# Patient Record
Sex: Female | Born: 1980 | Race: White | Hispanic: No | Marital: Married | Smoking: Current every day smoker
Health system: Southern US, Community
[De-identification: ages and names within clinical notes are randomized; demographics above are authoritative.]

## PROBLEM LIST (undated history)

## (undated) DIAGNOSIS — N946 Dysmenorrhea, unspecified: Secondary | ICD-10-CM

## (undated) DIAGNOSIS — M1990 Unspecified osteoarthritis, unspecified site: Secondary | ICD-10-CM

## (undated) DIAGNOSIS — J45909 Unspecified asthma, uncomplicated: Secondary | ICD-10-CM

## (undated) DIAGNOSIS — Z87442 Personal history of urinary calculi: Secondary | ICD-10-CM

## (undated) DIAGNOSIS — M797 Fibromyalgia: Secondary | ICD-10-CM

## (undated) DIAGNOSIS — F419 Anxiety disorder, unspecified: Secondary | ICD-10-CM

## (undated) DIAGNOSIS — F32A Depression, unspecified: Secondary | ICD-10-CM

## (undated) DIAGNOSIS — F329 Major depressive disorder, single episode, unspecified: Secondary | ICD-10-CM

## (undated) HISTORY — PX: COLPOSCOPY: SHX161

## (undated) HISTORY — DX: Depression, unspecified: F32.A

## (undated) HISTORY — DX: Anxiety disorder, unspecified: F41.9

## (undated) HISTORY — PX: TUBAL LIGATION: SHX77

## (undated) HISTORY — DX: Fibromyalgia: M79.7

## (undated) HISTORY — PX: CERVICAL BIOPSY  W/ LOOP ELECTRODE EXCISION: SUR135

## (undated) HISTORY — DX: Unspecified asthma, uncomplicated: J45.909

## (undated) HISTORY — DX: Unspecified osteoarthritis, unspecified site: M19.90

## (undated) HISTORY — DX: Major depressive disorder, single episode, unspecified: F32.9

## (undated) HISTORY — DX: Dysmenorrhea, unspecified: N94.6

---

## 2004-07-10 HISTORY — PX: WISDOM TOOTH EXTRACTION: SHX21

## 2014-07-10 DIAGNOSIS — M797 Fibromyalgia: Secondary | ICD-10-CM

## 2014-07-10 HISTORY — DX: Fibromyalgia: M79.7

## 2017-11-29 ENCOUNTER — Ambulatory Visit: Payer: Self-pay | Admitting: Family Medicine

## 2017-11-29 DIAGNOSIS — Z0289 Encounter for other administrative examinations: Secondary | ICD-10-CM

## 2017-11-30 ENCOUNTER — Encounter: Payer: Self-pay | Admitting: *Deleted

## 2017-11-30 ENCOUNTER — Encounter: Payer: Self-pay | Admitting: Family Medicine

## 2017-11-30 ENCOUNTER — Ambulatory Visit (INDEPENDENT_AMBULATORY_CARE_PROVIDER_SITE_OTHER): Payer: Managed Care, Other (non HMO) | Admitting: Family Medicine

## 2017-11-30 VITALS — BP 122/72 | HR 93 | Temp 98.4°F | Ht 65.0 in | Wt 163.2 lb

## 2017-11-30 DIAGNOSIS — Z975 Presence of (intrauterine) contraceptive device: Secondary | ICD-10-CM | POA: Diagnosis not present

## 2017-11-30 DIAGNOSIS — Z30432 Encounter for removal of intrauterine contraceptive device: Secondary | ICD-10-CM

## 2017-11-30 DIAGNOSIS — Z0000 Encounter for general adult medical examination without abnormal findings: Secondary | ICD-10-CM | POA: Diagnosis not present

## 2017-11-30 DIAGNOSIS — R197 Diarrhea, unspecified: Secondary | ICD-10-CM

## 2017-11-30 DIAGNOSIS — M797 Fibromyalgia: Secondary | ICD-10-CM

## 2017-11-30 DIAGNOSIS — Z114 Encounter for screening for human immunodeficiency virus [HIV]: Secondary | ICD-10-CM | POA: Diagnosis not present

## 2017-11-30 LAB — C-REACTIVE PROTEIN: CRP: 0.2 mg/dL — AB (ref 0.5–20.0)

## 2017-11-30 LAB — COMPREHENSIVE METABOLIC PANEL
ALBUMIN: 4.2 g/dL (ref 3.5–5.2)
ALK PHOS: 69 U/L (ref 39–117)
ALT: 10 U/L (ref 0–35)
AST: 13 U/L (ref 0–37)
BUN: 12 mg/dL (ref 6–23)
CO2: 25 mEq/L (ref 19–32)
Calcium: 9.4 mg/dL (ref 8.4–10.5)
Chloride: 105 mEq/L (ref 96–112)
Creatinine, Ser: 0.74 mg/dL (ref 0.40–1.20)
GFR: 94.15 mL/min (ref >60.00)
GLUCOSE: 82 mg/dL (ref 70–99)
Potassium: 3.9 mEq/L (ref 3.5–5.1)
Sodium: 139 mEq/L (ref 135–145)
Total Bilirubin: 0.7 mg/dL (ref 0.2–1.2)
Total Protein: 6.8 g/dL (ref 6.0–8.3)

## 2017-11-30 LAB — CBC WITH DIFFERENTIAL/PLATELET
BASOS ABS: 0.1 10*3/uL (ref 0.0–0.1)
Basophils Relative: 0.7 % (ref 0.0–3.0)
Eosinophils Absolute: 0.1 10*3/uL (ref 0.0–0.7)
Eosinophils Relative: 1.5 % (ref 0.0–5.0)
HCT: 46 % (ref 36.0–46.0)
HEMOGLOBIN: 16 g/dL — AB (ref 12.0–15.0)
LYMPHS ABS: 2.3 10*3/uL (ref 0.7–4.0)
LYMPHS PCT: 26.6 % (ref 12.0–46.0)
MCHC: 34.9 g/dL (ref 30.0–36.0)
MCV: 88.2 fl (ref 78.0–100.0)
MONOS PCT: 8.9 % (ref 3.0–12.0)
Monocytes Absolute: 0.8 10*3/uL (ref 0.1–1.0)
NEUTROS PCT: 62.3 % (ref 43.0–77.0)
Neutro Abs: 5.3 10*3/uL (ref 1.4–7.7)
Platelets: 166 10*3/uL (ref 150.0–400.0)
RBC: 5.21 Mil/uL — ABNORMAL HIGH (ref 3.87–5.11)
RDW: 13.5 % (ref 11.5–15.5)
WBC: 8.5 10*3/uL (ref 4.0–10.5)

## 2017-11-30 LAB — LIPID PANEL
Cholesterol: 198 mg/dL (ref 0–200)
HDL: 37.1 mg/dL — ABNORMAL LOW (ref >39.00)
NONHDL: 160.6
TRIGLYCERIDES: 219 mg/dL — AB (ref 0.0–149.0)
Total CHOL/HDL Ratio: 5
VLDL: 43.8 mg/dL — ABNORMAL HIGH (ref 0.0–40.0)

## 2017-11-30 LAB — TSH: TSH: 0.73 u[IU]/mL (ref 0.35–4.50)

## 2017-11-30 LAB — SEDIMENTATION RATE: Sed Rate: 26 mm/hr — ABNORMAL HIGH (ref 0–20)

## 2017-11-30 LAB — LDL CHOLESTEROL, DIRECT: Direct LDL: 129 mg/dL

## 2017-11-30 MED ORDER — VENLAFAXINE HCL ER 37.5 MG PO CP24: 37.5000 mg | ORAL_CAPSULE | Freq: Every day | ORAL | 1 refills | Status: DC

## 2017-11-30 NOTE — Progress Notes (Signed)
Patient: Kelli Washington MRN: [id number] DOB: December 24, 1980 PCP: Orma Flaming, MD     Subjective:  Chief Complaint  Patient presents with  . Establish Care  . Fibromyalgia    having pain all over pt rates a 6/10    HPI: The patient is a 37 y.o. female who presents today for annual exam. She denies any changes to past medical history. There have been no recent hospitalizations. They are not following a well balanced diet and exercise plan. Weight has been stable.  She works in a call center 8 hours/day and then has 3 kids that have activities. She also works 3 nights a week at [employer]. She recently got married and is also needing her IUD out.   Fibromyalgia: she was diagnosed about 2-3 years ago. Has been on cymbalta, gabapentin, flexeril, valium, skelaxin. 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 She states she just deals with the pain now. She occasionally takes ibuprofen, but not often. She has also done PT with not much benefit. She had full work up done for RA, ANA and imaging. She does not exercise or do yoga but felt good when she had a regular work out program.   Stomach pain: She states she started to have bowel issues about one year ago. She states it is progressing. She has immediate diarrhea after eating some meals. She will have knots in her stomach/cramping and then will have immediate diarrhea. She usually feels better after she uses the bathroom. No N/V or blood in stool. She does state sometimes she feels like food gets stuck in her esopahgus and she gets pain with some food. No family hx of IBD. She has had her gallbladder checked a "few times" for this same issue. Seems like this has been going on longer than a year. Very rarely has constipation. No endoscope or colonoscopy has been done.    Immunization History  Administered Date(s) Administered  . Tdap 04/02/2012    Pap smear: 1 year ago. Hx of LEEP x 2. LEEP last done 4 years ago and pap smears normal  since that time.    Review of Systems  Constitutional: Negative for chills, fatigue and fever.  HENT: Negative for sinus pain and sore throat.   Respiratory: Negative for cough.   Cardiovascular: Negative.   Gastrointestinal: Positive for abdominal pain and diarrhea. Negative for blood in stool, constipation, nausea and vomiting.  Genitourinary: Negative for difficulty urinating, dysuria, frequency, urgency, vaginal bleeding, vaginal discharge and vaginal pain.  Musculoskeletal: Positive for arthralgias, back pain, joint swelling, myalgias, neck pain and neck stiffness.  Neurological: Positive for dizziness, numbness and headaches.    Allergies Patient has No Known Allergies.  Past Medical History Patient  has a past medical history of Arthritis, Asthma, Fibromyalgia (2016), and Vaginal delivery (1999,2002,2010,2013).  Surgical History Patient  has a past surgical history that includes Tubal ligation and Wisdom tooth extraction (2006).  Family History Pateint's family history includes Arthritis in her mother; Asthma in her mother; Cancer in her mother; Depression in her mother; Drug abuse in her father and mother; Early death in her mother; Heart attack in her father; Heart disease in her father; Kidney disease in her father; Stroke in her father.  Social History Patient  reports that she has been smoking cigarettes.  She has been smoking about 1.00 pack per day. She has never used smokeless tobacco. She reports that she drinks alcohol. She reports that she does not use drugs.    Objective:  Vitals:   11/30/17 1100  BP: 122/72  Pulse: 93  Temp: 98.4 F (36.9 C)  TempSrc: Oral  SpO2: 95%  Weight: 163 lb 4 oz (74 kg)  Height: '5\' 5"'  (1.651 m)    Body mass index is 27.17 kg/m.  Physical Exam  Constitutional: She is oriented to person, place, and time. She appears well-developed and well-nourished.  Tobacco odor. Piercing in chin and multiple tattoos.   HENT:  Right Ear:  External ear normal.  Left Ear: External ear normal.  Mouth/Throat: Oropharynx is clear and moist.  Eyes: Pupils are equal, round, and reactive to light. Conjunctivae and EOM are normal.  Neck: Normal range of motion. Neck supple. No thyromegaly present.  Cardiovascular: Normal rate, regular rhythm, normal heart sounds and intact distal pulses.  No murmur heard. Pulmonary/Chest: Effort normal and breath sounds normal.  Abdominal: Soft. Bowel sounds are normal. She exhibits no distension. There is no tenderness.  Genitourinary: Vagina normal.  Lymphadenopathy:    She has no cervical adenopathy.  Neurological: She is alert and oriented to person, place, and time. She displays normal reflexes. No cranial nerve deficit. Coordination normal.  Skin: Skin is warm and dry. No rash noted.  Psychiatric: She has a normal mood and affect. Her behavior is normal.  Vitals reviewed.   iud removal -verbal consent obtained. Risks discussed included, but not limited to, pain, bleeding, discharge. Immediately fertile, but she has BTL. Speculum inserted without any issue and cervix visualized. Strings visualized out of os.  skylar forceps used to grasp strings and IUD easily removed with no complications. Patient tolerated well.     Assessment/plan: 1. Annual physical exam Routine lab work. IUD removed. Need records from old physician and am requesting these as she has had what appears to be a lot of work ups. Discussed need for smoking cessation.  Patient counseling '[x]'    Nutrition: Stressed importance of moderation in sodium/caffeine intake, saturated fat and cholesterol, caloric balance, sufficient intake of fresh fruits, vegetables, fiber, calcium, iron, and 1 mg of folate supplement per day (for females capable of pregnancy).  '[x]'    Stressed the importance of regular exercise.   '[]'    Substance Abuse: Discussed cessation/primary prevention of tobacco, alcohol, or other drug use; driving or other dangerous  activities under the influence; availability of treatment for abuse.   '[x]'    Injury prevention: Discussed safety belts, safety helmets, smoke detector, smoking near bedding or upholstery.   '[x]'    Sexuality: Discussed sexually transmitted diseases, partner selection, use of condoms, avoidance of unintended pregnancy  and contraceptive alternatives.  '[x]'    Dental health: Discussed importance of regular tooth brushing, flossing, and dental visits.  '[x]'    Health maintenance and immunizations reviewed. Please refer to Health maintenance section.    - CBC with Differential/Platelet - Comprehensive metabolic panel - TSH - Lipid panel  2. Encounter for screening for HIV  - HIV antibody  3. Fibromyalgia She has been on multiple agents and failed them all. She also admits to MJ use and I will not give her narcotics or lyrica. Will do trial of effexor. Side effects discussed and SI/HI precautions given. Call 911 or go to ED if this happens. She has no past hx of this. Could also do trial of CBD oil. Want her to try yoga or some for of exercise as this is the best treatment for fibromyalgia. Will see her back in one month to see how medication/exercise is working for her. Call sooner if any  issues with medication.   4. Diarrhea, unspecified type Hx consistent with IBS-D, but long standing hx will check for IBD with ESR/CRP and celiac's panel. Send to GI to see if she needs scoped. Also discussed nature of her IBS, smoking and foods to avoid. Commonly seen with fibromyalgia. Want her to keep a food journal over the next month so we can see what foods trigger this and how long cramping lasts or if relieved with diarrhea.  - C-reactive protein - Sedimentation rate - Ambulatory referral to Gastroenterology - Gliadin antibodies, serum - Tissue transglutaminase, IgA - Reticulin Antibody, IgA w reflex titer  5. Encounter for IUD removal IUD removed with no complications. BTL for birth control. F/u with gyn  for next IUD insertion. discussed may have some irregular bleeding/cramping.   6. IUD contraception  - Ambulatory referral to Gynecology    Return in about 1 month (around 12/31/2017) for drug/fibro .     Orma Flaming, MD [place]  11/30/2017

## 2017-12-04 ENCOUNTER — Encounter: Payer: Self-pay | Admitting: Internal Medicine

## 2017-12-04 ENCOUNTER — Telehealth: Payer: Self-pay | Admitting: Obstetrics and Gynecology

## 2017-12-04 NOTE — Telephone Encounter (Signed)
Called and left a message for patient to call back to schedule a new patient doctor referral appointment with our office to see any provider for IUD placement.

## 2017-12-05 LAB — TISSUE TRANSGLUTAMINASE, IGA: (tTG) Ab, IgA: 1 U/mL

## 2017-12-05 LAB — GLIADIN ANTIBODIES, SERUM
GLIADIN IGA: 5 U
Gliadin IgG: 8 Units

## 2017-12-05 LAB — HIV ANTIBODY (ROUTINE TESTING W REFLEX): HIV: NONREACTIVE

## 2017-12-05 LAB — RETICULIN ANTIBODIES, IGA W TITER: RETICULIN IGA SCREEN: NEGATIVE

## 2017-12-06 ENCOUNTER — Encounter: Payer: Self-pay | Admitting: Obstetrics and Gynecology

## 2017-12-06 ENCOUNTER — Other Ambulatory Visit (HOSPITAL_COMMUNITY)
Admission: RE | Admit: 2017-12-06 | Discharge: 2017-12-06 | Disposition: A | Discharge status: Home / Self Care | Payer: Managed Care, Other (non HMO) | Source: Ambulatory Visit | Attending: Obstetrics and Gynecology | Admitting: Obstetrics and Gynecology

## 2017-12-06 ENCOUNTER — Other Ambulatory Visit: Payer: Self-pay

## 2017-12-06 ENCOUNTER — Ambulatory Visit (INDEPENDENT_AMBULATORY_CARE_PROVIDER_SITE_OTHER): Payer: Managed Care, Other (non HMO) | Admitting: Obstetrics and Gynecology

## 2017-12-06 VITALS — BP 102/60 | HR 68 | Resp 16 | Ht 65.0 in | Wt 162.0 lb

## 2017-12-06 DIAGNOSIS — Z124 Encounter for screening for malignant neoplasm of cervix: Secondary | ICD-10-CM | POA: Insufficient documentation

## 2017-12-06 DIAGNOSIS — N898 Other specified noninflammatory disorders of vagina: Secondary | ICD-10-CM

## 2017-12-06 DIAGNOSIS — N644 Mastodynia: Secondary | ICD-10-CM

## 2017-12-06 DIAGNOSIS — Z01419 Encounter for gynecological examination (general) (routine) without abnormal findings: Secondary | ICD-10-CM | POA: Diagnosis not present

## 2017-12-06 DIAGNOSIS — N920 Excessive and frequent menstruation with regular cycle: Secondary | ICD-10-CM

## 2017-12-06 NOTE — Patient Instructions (Addendum)
To try and decrease your breast pain, you should have a well fitting supportive bra, cut back on caffeine, and use ice or heat as needed. Some women find relief with the supplement evening primrose oil.  EXERCISE AND DIET:  We recommended that you start or continue a regular exercise program for good health. Regular exercise means any activity that makes your heart beat faster and makes you sweat.  We recommend exercising at least 30 minutes per day at least 3 days a week, preferably 4 or 5.  We also recommend a diet low in fat and sugar.  Inactivity, poor dietary choices and obesity can cause diabetes, heart attack, stroke, and kidney damage, among others.    ALCOHOL AND SMOKING:  Women should limit their alcohol intake to no more than 7 drinks/beers/glasses of wine (combined, not each!) per week. Moderation of alcohol intake to this level decreases your risk of breast cancer and liver damage. And of course, no recreational drugs are part of a healthy lifestyle.  And absolutely no smoking or even second hand smoke. Most people know smoking can cause heart and lung diseases, but did you know it also contributes to weakening of your bones? Aging of your skin?  Yellowing of your teeth and nails?  CALCIUM AND VITAMIN D:  Adequate intake of calcium and Vitamin D are recommended.  The recommendations for exact amounts of these supplements seem to change often, but generally speaking 600 mg of calcium (either carbonate or citrate) and 800 units of Vitamin D per day seems prudent. Certain women may benefit from higher intake of Vitamin D.  If you are among these women, your doctor will have told you during your visit.    PAP SMEARS:  Pap smears, to check for cervical cancer or precancers,  have traditionally been done yearly, although recent scientific advances have shown that most women can have pap smears less often.  However, every woman still should have a physical exam from her gynecologist every year. It will  include a breast check, inspection of the vulva and vagina to check for abnormal growths or skin changes, a visual exam of the cervix, and then an exam to evaluate the size and shape of the uterus and ovaries.  And after 37 years of age, a rectal exam is indicated to check for rectal cancers. We will also provide age appropriate advice regarding health maintenance, like when you should have certain vaccines, screening for sexually transmitted diseases, bone density testing, colonoscopy, mammograms, etc.   MAMMOGRAMS:  All women over 56 years old should have a yearly mammogram. Many facilities now offer a "3D" mammogram, which may cost around $50 extra out of pocket. If possible,  we recommend you accept the option to have the 3D mammogram performed.  It both reduces the number of women who will be called back for extra views which then turn out to be normal, and it is better than the routine mammogram at detecting truly abnormal areas.    COLONOSCOPY:  Colonoscopy to screen for colon cancer is recommended for all women at age 21.  We know, you hate the idea of the prep.  We agree, BUT, having colon cancer and not knowing it is worse!!  Colon cancer so often starts as a polyp that can be seen and removed at colonscopy, which can quite literally save your life!  And if your first colonoscopy is normal and you have no family history of colon cancer, most women don't have to have  it again for 10 years.  Once every ten years, you can do something that may end up saving your life, right?  We will be happy to help you get it scheduled when you are ready.  Be sure to check your insurance coverage so you understand how much it will cost.  It may be covered as a preventative service at no cost, but you should check your particular policy.      Breast Self-Awareness Breast self-awareness means being familiar with how your breasts look and feel. It involves checking your breasts regularly and reporting any changes to  your health care provider. Practicing breast self-awareness is important. A change in your breasts can be a sign of a serious medical problem. Being familiar with how your breasts look and feel allows you to find any problems early, when treatment is more likely to be successful. All women should practice breast self-awareness, including women who have had breast implants. How to do a breast self-exam One way to learn what is normal for your breasts and whether your breasts are changing is to do a breast self-exam. To do a breast self-exam: Look for Changes  1. Remove all the clothing above your waist. 2. Stand in front of a mirror in a room with good lighting. 3. Put your hands on your hips. 4. Push your hands firmly downward. 5. Compare your breasts in the mirror. Look for differences between them (asymmetry), such as: ? Differences in shape. ? Differences in size. ? Puckers, dips, and bumps in one breast and not the other. 6. Look at each breast for changes in your skin, such as: ? Redness. ? Scaly areas. 7. Look for changes in your nipples, such as: ? Discharge. ? Bleeding. ? Dimpling. ? Redness. ? A change in position. Feel for Changes  Carefully feel your breasts for lumps and changes. It is best to do this while lying on your back on the floor and again while sitting or standing in the shower or tub with soapy water on your skin. Feel each breast in the following way:  Place the arm on the side of the breast you are examining above your head.  Feel your breast with the other hand.  Start in the nipple area and make  inch (2 cm) overlapping circles to feel your breast. Use the pads of your three middle fingers to do this. Apply light pressure, then medium pressure, then firm pressure. The light pressure will allow you to feel the tissue closest to the skin. The medium pressure will allow you to feel the tissue that is a little deeper. The firm pressure will allow you to feel the  tissue close to the ribs.  Continue the overlapping circles, moving downward over the breast until you feel your ribs below your breast.  Move one finger-width toward the center of the body. Continue to use the  inch (2 cm) overlapping circles to feel your breast as you move slowly up toward your collarbone.  Continue the up and down exam using all three pressures until you reach your armpit.  Write Down What You Find  Write down what is normal for each breast and any changes that you find. Keep a written record with breast changes or normal findings for each breast. By writing this information down, you do not need to depend only on memory for size, tenderness, or location. Write down where you are in your menstrual cycle, if you are still menstruating. If you are  having trouble noticing differences in your breasts, do not get discouraged. With time you will become more familiar with the variations in your breasts and more comfortable with the exam. How often should I examine my breasts? Examine your breasts every month. If you are breastfeeding, the best time to examine your breasts is after a feeding or after using a breast pump. If you menstruate, the best time to examine your breasts is 5-7 days after your period is over. During your period, your breasts are lumpier, and it may be more difficult to notice changes. When should I see my health care provider? See your health care provider if you notice:  A change in shape or size of your breasts or nipples.  A change in the skin of your breast or nipples, such as a reddened or scaly area.  Unusual discharge from your nipples.  A lump or thick area that was not there before.  Pain in your breasts.  Anything that concerns you.  This information is not intended to replace advice given to you by your health care provider. Make sure you discuss any questions you have with your health care provider. Document Released: 06/26/2005 Document  Revised: 12/02/2015 Document Reviewed: 05/16/2015 Elsevier Interactive Patient Education  [employer].

## 2017-12-06 NOTE — Progress Notes (Signed)
Patient scheduled while in office for bilateral Dx MMG and left breast US, if needed. Scheduled at [facility] on 12/07/17 arriving at 12:50pm for 1:10pm appt. Patient verbalizes understanding and is agreeable.

## 2017-12-06 NOTE — Progress Notes (Signed)
37 y.o. [id number] MarriedCaucasianF here for annual exam.  She just had a mirena IUD removed last week (expired). Prior to the IUD she bleed monthly x 9 days, saturating a pad and tampon in 2 hours. Wants another IUD for cycle control.  With the IUD she wasn't bleeding at all, just occasional cramps. No dyspareunia. She c/o a several year h/o intermittent left lateral breast pain. More consistent and stronger in the last 6 months. Tender when she pushes in that area.      No LMP recorded (lmp unknown). (Menstrual status: Other).          Sexually active: Yes.    The current method of family planning is tubal ligation.    Exercising: No.  The patient does not participate in regular exercise at present. Smoker:  yes  Health Maintenance: Pap:  2017 WNL per patient   History of abnormal Pap:  Yes HX of LEEP X 2, last one in 2014.  TDaP:  03-23-12 Gardasil: no, counseled, interested if covered.     reports that she has been smoking cigarettes.  She has been smoking about 1.00 pack per day. She has never used smokeless tobacco. She reports that she drinks about 0.6 - 1.2 oz of alcohol per week. She reports that she does not use drugs. 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   Past Medical History:  Diagnosis Date  . Anxiety   . Arthritis   . Asthma   . Depression   . Dysmenorrhea   . Fibromyalgia 2016  . Vaginal delivery 1999,2002,2010,2013    Past Surgical History:  Procedure Laterality Date  . CERVICAL BIOPSY  W/ LOOP ELECTRODE EXCISION    . COLPOSCOPY    . TUBAL LIGATION    . WISDOM TOOTH EXTRACTION  2006    Current Outpatient Medications  Medication Sig Dispense Refill  . ibuprofen (ADVIL,MOTRIN) 200 MG tablet Take 600 mg by mouth every 6 (six) hours as needed.    . venlafaxine XR (EFFEXOR-XR) 37.5 MG 24 hr capsule Take 1 capsule (37.5 mg total) by mouth daily with breakfast. 30 capsule 1   No current facility-administered medications for this visit.     Family  History  Problem Relation Age of Onset  . Arthritis Mother   . Asthma Mother   . Cancer Mother        liver  . Depression Mother   . Drug abuse Mother   . Early death Mother   . Drug abuse Father   . Heart attack Father   . Heart disease Father   . Kidney disease Father   . Stroke Father   . Stroke Maternal Grandmother     Review of Systems  Constitutional: Negative.   HENT: Negative.   Eyes: Negative.   Respiratory: Negative.   Cardiovascular: Negative.   Gastrointestinal: Negative.   Endocrine: Negative.   Genitourinary: Positive for vaginal discharge.  Musculoskeletal: Negative.   Skin: Negative.   Allergic/Immunologic: Negative.   Neurological: Negative.   Psychiatric/Behavioral: Negative.   She c/o intermittent vaginal discharge, occasional odor.   Exam:   BP 102/60 (BP Location: Right Arm, Patient Position: Sitting, Cuff Size: Normal)   Pulse 68   Resp 16   Ht  (1.651 m)   Wt 162 lb (73.5 kg)   LMP  (LMP Unknown)   BMI 26.96 kg/m   Weight change: @ Height:   Height:  (165.1 cm)  Ht Readings from Last 3  Encounters:  12/06/17  (1.651 m)  11/30/17  (1.651 m)    General appearance: alert, cooperative and appears stated age Head: Normocephalic, without obvious abnormality, atraumatic Neck: no adenopathy, supple, symmetrical, trachea midline and thyroid normal to inspection and palpation Lungs: clear to auscultation bilaterally Cardiovascular: regular rate and rhythm Breasts: fibrocystic changes and tenderness left breast 3 o'clock. No masses, no skin changes.  Abdomen: soft, non-tender; non distended,  no masses,  no organomegaly Extremities: extremities normal, atraumatic, no cyanosis or edema Skin: Skin color, texture, turgor normal. No rashes or lesions Lymph nodes: Cervical, supraclavicular, and axillary nodes normal. No abnormal inguinal nodes palpated Neurologic: Grossly normal   Pelvic: External genitalia:  no  lesions              Urethra:  normal appearing urethra with no masses, tenderness or lesions              Bartholins and Skenes: normal                 Vagina: normal appearing vagina with a slight increase in watery, yellowish, frothy vaginal d/c              Cervix: no lesions               Bimanual Exam:  Uterus:  normal size, contour, position, consistency, mobility, non-tender and anteverted              Adnexa: no mass, fullness, tenderness               Rectovaginal: Confirms               Anus:  normal sphincter tone, no lesions  Chaperone was present for exam.  A:  Well Woman with normal exam  H/O menorrhagia, desires another mirena IUD  Left breast pain  Vaginal d/c, suspect BV  P:   Pap with hpv  Discussed breast self exam  Discussed calcium and vit D intake  Labs with primary  Breast imaging, attention left breast 3 o'clock  Affirm sent  Will set up an appointment for mirena IUD insertion   Check on coverage of the gardasil vaccination

## 2017-12-07 ENCOUNTER — Ambulatory Visit
Admission: RE | Admit: 2017-12-07 | Discharge: 2017-12-07 | Disposition: A | Discharge status: Home / Self Care | Payer: Managed Care, Other (non HMO) | Source: Ambulatory Visit | Attending: Obstetrics and Gynecology | Admitting: Obstetrics and Gynecology

## 2017-12-07 ENCOUNTER — Telehealth: Payer: Self-pay | Admitting: *Deleted

## 2017-12-07 ENCOUNTER — Telehealth: Payer: Self-pay | Admitting: Obstetrics and Gynecology

## 2017-12-07 DIAGNOSIS — N644 Mastodynia: Secondary | ICD-10-CM

## 2017-12-07 LAB — VAGINITIS/VAGINOSIS, DNA PROBE
CANDIDA SPECIES: NEGATIVE
Gardnerella vaginalis: POSITIVE — AB
TRICHOMONAS VAG: NEGATIVE

## 2017-12-07 MED ORDER — METRONIDAZOLE 500 MG PO TABS: 500.0000 mg | ORAL_TABLET | Freq: Two times a day (BID) | ORAL | 0 refills | Status: DC

## 2017-12-07 NOTE — Telephone Encounter (Signed)
-----   Message from Romualdo BolkJill Evelyn Jertson, MD sent at 12/07/2017 10:51 AM EDT ----- Please inform the patient that her vaginitis probe was + for BV and treat with flagyl (either oral or vaginal, her choice), no ETOH while on Flagyl.  Oral: Flagyl 500 mg BID x 7 days, or Vaginal: Metrogel, 1 applicator per vagina q day x 5 days.

## 2017-12-07 NOTE — Telephone Encounter (Signed)
Call placed to convey benefits. 

## 2017-12-07 NOTE — Telephone Encounter (Signed)
Spoke with patient and gave results and recommendations. RX for Flagyl sent into pharmacy. Advised patient to avoid alcohol intake while taking RX. Patient voiced understanding -eh

## 2017-12-10 ENCOUNTER — Telehealth: Payer: Self-pay

## 2017-12-10 NOTE — Telephone Encounter (Signed)
Left message to call Kaitlyn at [phone]. 

## 2017-12-10 NOTE — Telephone Encounter (Signed)
Spoke with patient. Advised of message as seen below from Dr.Jertson. Patient verbalizes understanding. Removed from mammogram hold.

## 2017-12-10 NOTE — Telephone Encounter (Signed)
-----   Message from Romualdo BolkJill Evelyn Jertson, MD sent at 12/10/2017 10:25 AM EDT ----- We discussed management of breast pain at her last visit. Her imaging was normal. Please advise the patient to do monthly breast self exams and call with any concerns. Take out of mammogram hold.

## 2017-12-11 LAB — CYTOLOGY - PAP
DIAGNOSIS: NEGATIVE
HPV 16/18/45 genotyping: NEGATIVE
HPV: DETECTED — AB

## 2017-12-13 NOTE — Telephone Encounter (Signed)
Call placed to convey benefits. 

## 2017-12-19 ENCOUNTER — Telehealth: Payer: Self-pay | Admitting: *Deleted

## 2017-12-19 NOTE — Telephone Encounter (Signed)
-----   Message from Romualdo BolkJill Evelyn Jertson, MD sent at 12/11/2017  5:55 PM EDT ----- Check if the patient has had 2 normal paps since her leep, if not she needs a colposcopy. Otherwise please add hpv 16/18/45 to determine the need for colposcopy now or f/u pap and hpv in one year.

## 2017-12-19 NOTE — Telephone Encounter (Signed)
Spoke with patient and she has had two normal PAPs since her LEEP. Added additional HPV testing. -eh

## 2017-12-19 NOTE — Telephone Encounter (Signed)
Left message to call back regarding PAP results -eh

## 2017-12-29 ENCOUNTER — Encounter (HOSPITAL_COMMUNITY): Payer: Self-pay

## 2017-12-29 ENCOUNTER — Other Ambulatory Visit: Payer: Self-pay

## 2017-12-29 ENCOUNTER — Emergency Department (HOSPITAL_COMMUNITY)
Admission: EM | Admit: 2017-12-29 | Discharge: 2017-12-29 | Disposition: A | Discharge status: Home / Self Care | Payer: Managed Care, Other (non HMO) | Attending: Emergency Medicine | Admitting: Emergency Medicine

## 2017-12-29 DIAGNOSIS — Z79899 Other long term (current) drug therapy: Secondary | ICD-10-CM | POA: Diagnosis not present

## 2017-12-29 DIAGNOSIS — F17210 Nicotine dependence, cigarettes, uncomplicated: Secondary | ICD-10-CM | POA: Diagnosis not present

## 2017-12-29 DIAGNOSIS — J45909 Unspecified asthma, uncomplicated: Secondary | ICD-10-CM | POA: Insufficient documentation

## 2017-12-29 DIAGNOSIS — N939 Abnormal uterine and vaginal bleeding, unspecified: Secondary | ICD-10-CM | POA: Insufficient documentation

## 2017-12-29 LAB — WET PREP, GENITAL
Sperm: NONE SEEN
Trich, Wet Prep: NONE SEEN
WBC, Wet Prep HPF POC: NONE SEEN
Yeast Wet Prep HPF POC: NONE SEEN

## 2017-12-29 LAB — CBC
HCT: 47.9 % — ABNORMAL HIGH (ref 36.0–46.0)
Hemoglobin: 15.8 g/dL — ABNORMAL HIGH (ref 12.0–15.0)
MCH: 29.8 pg (ref 26.0–34.0)
MCHC: 33 g/dL (ref 30.0–36.0)
MCV: 90.2 fL (ref 78.0–100.0)
Platelets: 173 10*3/uL (ref 150–400)
RBC: 5.31 MIL/uL — ABNORMAL HIGH (ref 3.87–5.11)
RDW: 13.2 % (ref 11.5–15.5)
WBC: 8.9 10*3/uL (ref 4.0–10.5)

## 2017-12-29 LAB — I-STAT BETA HCG BLOOD, ED (MC, WL, AP ONLY): I-stat hCG, quantitative: <5 m[IU]/mL (ref <5)

## 2017-12-29 NOTE — ED Provider Notes (Signed)
[facility] EMERGENCY DEPARTMENT Provider Note   CSN: [id number] Arrival date & time: 12/29/17  1344  History   Chief Complaint Chief Complaint  Patient presents with  . Vaginal Bleeding    HPI Kelli Washington is a 37 y.o. female.  The history is provided by the patient.  Vaginal Bleeding  Primary symptoms include pelvic pain, vaginal bleeding.  Primary symptoms include no dysuria. There has been no fever. This is a new problem. The current episode started 2 days ago. The problem occurs constantly. She is not pregnant. She has not missed her period. LMP: has not had a period in multiple years because she had an IUD, which was just removed 1 month ago. Pertinent negatives include no abdominal swelling, no abdominal pain, no vomiting and no light-headedness. She has tried nothing for the symptoms. Associated medical issues do not include ectopic pregnancy.    Past Medical History:  Diagnosis Date  . Anxiety   . Arthritis   . Asthma   . Depression   . Dysmenorrhea   . Fibromyalgia 2016  . Vaginal delivery 1999,2002,2010,2013    There are no active problems to display for this patient.   Past Surgical History:  Procedure Laterality Date  . CERVICAL BIOPSY  W/ LOOP ELECTRODE EXCISION    . COLPOSCOPY    . TUBAL LIGATION    . WISDOM TOOTH EXTRACTION  2006     OB History    Gravida  4   Para  4   Term  4   Preterm      AB      Living  4     SAB      TAB      Ectopic      Multiple      Live Births  4            Home Medications    Prior to Admission medications   Medication Sig Start Date End Date Taking? Authorizing Provider  venlafaxine XR (EFFEXOR-XR) 37.5 MG 24 hr capsule Take 1 capsule (37.5 mg total) by mouth daily with breakfast. 11/30/17  Yes Orland MustardWolfe, Allison, MD  metroNIDAZOLE (FLAGYL) 500 MG tablet Take 1 tablet (500 mg total) by mouth 2 (two) times daily. Patient not taking: Reported on 12/29/2017 12/07/17   Romualdo BolkJertson, Jill  Evelyn, MD    Family History Family History  Problem Relation Age of Onset  . Arthritis Mother   . Asthma Mother   . Cancer Mother        liver  . Depression Mother   . Drug abuse Mother   . Early death Mother   . Drug abuse Father   . Heart attack Father   . Heart disease Father   . Kidney disease Father   . Stroke Father   . Stroke Maternal Grandmother     Social History Social History   Tobacco Use  . Smoking status: Current Every Day Smoker    Packs/day: 1.00    Types: Cigarettes  . Smokeless tobacco: Never Used  Substance Use Topics  . Alcohol use: Yes    Alcohol/week: 0.6 - 1.2 oz    Types: 1 - 2 Standard drinks or equivalent per week  . Drug use: Never     Allergies   Patient has no known allergies.   Review of Systems Review of Systems  Constitutional: Positive for fatigue. Negative for chills and fever.  HENT: Negative for ear pain and nosebleeds.   Eyes: Negative  for pain and visual disturbance.  Respiratory: Negative for cough and shortness of breath.   Cardiovascular: Negative for chest pain and palpitations.  Gastrointestinal: Negative for abdominal pain, blood in stool and vomiting.  Genitourinary: Positive for pelvic pain and vaginal bleeding. Negative for dysuria and hematuria.  Musculoskeletal: Negative for arthralgias and back pain.  Skin: Negative for color change and rash.  Neurological: Negative for seizures, syncope and light-headedness.  All other systems reviewed and are negative.    Physical Exam Updated Vital Signs BP 100/61   Pulse 73   Temp 98.4 F (36.9 C) (Oral)   Resp 14   LMP  (LMP Unknown) Comment: tubal  SpO2 96%   Physical Exam  Constitutional: She appears well-developed and well-nourished. No distress.  HENT:  Head: Normocephalic and atraumatic.  Mouth/Throat: Oropharynx is clear and moist.  Eyes: Conjunctivae are normal.  Neck: Neck supple.  Cardiovascular: Normal rate and regular rhythm.  No murmur  heard. Pulmonary/Chest: Effort normal and breath sounds normal. No respiratory distress.  Abdominal: Soft. There is no tenderness.  Genitourinary:  Genitourinary Comments: Chaperone in room for exam: no external genital lesions, dark blood from vaginal vault with no clots, no adnexal tenderness or masses, no CMT  Musculoskeletal: She exhibits no edema.  Neurological: She is alert.  Skin: Skin is warm and dry.  Psychiatric: She has a normal mood and affect.  Nursing note and vitals reviewed.    ED Treatments / Results  Labs (all labs ordered are listed, but only abnormal results are displayed) Labs Reviewed  WET PREP, GENITAL - Abnormal; Notable for the following components:      Result Value   Clue Cells Wet Prep HPF POC PRESENT (*)    All other components within normal limits  CBC - Abnormal; Notable for the following components:   RBC 5.31 (*)    Hemoglobin 15.8 (*)    HCT 47.9 (*)    All other components within normal limits  I-STAT BETA HCG BLOOD, ED ([facility], [facility], [facility] ONLY)  GC/CHLAMYDIA PROBE AMP ([place]) NOT AT [facility]    EKG None  Radiology No results found.  Procedures Procedures (including critical care time)  Medications Ordered in ED Medications - No data to display   Initial Impression / Assessment and Plan / ED Course  I have reviewed the triage vital signs and the nursing notes.  Pertinent labs & imaging results that were available during my care of the patient were reviewed by me and considered in my medical decision making (see chart for details).     Kelli Washington is a 37 y.o. female with PMHx of IUD removal 1 month ago who p/w vaginal bleeding x 2 days. Reviewed and confirmed nursing documentation for past medical history, family history, social history. VS afebrile, wnl. Exam benign, small amount of bleeding from vaginal vault. Likely heavy menstruation, though will r/o ectopic. No VS or clinical evidence of anemia.   Beta neg. CBC no  leukocytosis, nl hgb. Wet prep with clue cells, no WBC. Pt with no vaginal discharge on history or exam, not likely related to BV. No indication for emergent imaging at this time. Offered to start patient on oral OCPs for bleeding, but she declined, plans to get IUD in 1 month with OB.  Old records reviewed. Labs reviewed by me and used in the medical decision making. D/c home in stable condition, return precautions discussed. Patient agreeable with plan for d/c home.   Final Clinical Impressions(s) / ED  Diagnoses   Final diagnoses:  Vaginal bleeding    ED Discharge Orders    None       Diannia Ruder, MD 12/29/17 2312    Mesner, Barbara Cower, MD 12/30/17 [phone]

## 2017-12-29 NOTE — ED Provider Notes (Signed)
I saw and evaluated the patient, reviewed the resident's note and I agree with the findings and plan with the following exceptions.   37 year old female here with vaginal bleeding after being her IUD removed last month.  This is her first.  Since that time.  She has some abdominal cramping to go along with it.  She just states that she is concerned she would lost a much blood that she is never bled this much before with her menstrual cycle.  No lightheadedness or dizziness.  Plans to get IUD replaced next month and does not want any birth control in the meantime. Abdomen benign.  Resident performed pelvic exam and pending results.  If all normal will discharge to follow-up with gynecologist.   Kelli Washington, Kelli Gilardi, MD 12/30/17 [phone]

## 2017-12-29 NOTE — ED Notes (Signed)
Ed res  At bedside  She saw before myself

## 2017-12-29 NOTE — ED Triage Notes (Signed)
Pt presents for evaluation of heavy vaginal bleeding since yesterday. Pt reports had IUD that was removed last month, this is first episode of bleeding. States has gone through an entire box of tampons in 24 hours. States severe abd cramping. Hx of tubal ligation.

## 2017-12-31 ENCOUNTER — Telehealth: Payer: Self-pay | Admitting: *Deleted

## 2017-12-31 LAB — GC/CHLAMYDIA PROBE AMP (~~LOC~~) NOT AT ARMC
Chlamydia: NEGATIVE
Neisseria Gonorrhea: NEGATIVE

## 2017-12-31 NOTE — Telephone Encounter (Signed)
Left patient a detailed message regarding PAP results. Patient placed in 08 recall -eh

## 2018-01-02 NOTE — Progress Notes (Deleted)
Patient: Kelli AndersonJennifer Washington MRN: [id number] DOB: 1980-09-10 PCP: Orland MustardWolfe, Allison, MD     Subjective:  No chief complaint on file.   HPI: The patient is a 37 y.o. female who presents today for ***  Review of Systems  Allergies Patient has No Known Allergies.  Past Medical History Patient  has a past medical history of Anxiety, Arthritis, Asthma, Depression, Dysmenorrhea, Fibromyalgia (2016), and Vaginal delivery (1999,2002,2010,2013).  Surgical History Patient  has a past surgical history that includes Tubal ligation; Wisdom tooth extraction (2006); Cervical biopsy w/ loop electrode excision; and Colposcopy.  Family History Pateint's family history includes Arthritis in her mother; Asthma in her mother; Cancer in her mother; Depression in her mother; Drug abuse in her father and mother; Early death in her mother; Heart attack in her father; Heart disease in her father; Kidney disease in her father; Stroke in her father and maternal grandmother.  Social History Patient  reports that she has been smoking cigarettes.  She has been smoking about 1.00 pack per day. She has never used smokeless tobacco. She reports that she drinks about 0.6 - 1.2 oz of alcohol per week. She reports that she does not use drugs.    Objective: There were no vitals filed for this visit.  There is no height or weight on file to calculate BMI.  Physical Exam     Assessment/plan:      No follow-ups on file.     @[facility] @ 01/03/2018

## 2018-01-03 ENCOUNTER — Other Ambulatory Visit: Payer: Self-pay

## 2018-01-03 ENCOUNTER — Encounter: Payer: Self-pay | Admitting: Family Medicine

## 2018-01-03 ENCOUNTER — Ambulatory Visit: Payer: Managed Care, Other (non HMO) | Admitting: Family Medicine

## 2018-01-03 ENCOUNTER — Emergency Department (HOSPITAL_COMMUNITY)
Admission: EM | Admit: 2018-01-03 | Discharge: 2018-01-03 | Disposition: A | Discharge status: Home / Self Care | Payer: Managed Care, Other (non HMO) | Attending: Emergency Medicine | Admitting: Emergency Medicine

## 2018-01-03 ENCOUNTER — Emergency Department (HOSPITAL_COMMUNITY): Payer: Managed Care, Other (non HMO)

## 2018-01-03 ENCOUNTER — Ambulatory Visit (INDEPENDENT_AMBULATORY_CARE_PROVIDER_SITE_OTHER): Payer: Managed Care, Other (non HMO) | Admitting: Family Medicine

## 2018-01-03 ENCOUNTER — Encounter (HOSPITAL_COMMUNITY): Payer: Self-pay | Admitting: Emergency Medicine

## 2018-01-03 VITALS — BP 102/72 | HR 73 | Temp 98.3°F | Ht 65.0 in | Wt 161.2 lb

## 2018-01-03 DIAGNOSIS — J45909 Unspecified asthma, uncomplicated: Secondary | ICD-10-CM | POA: Diagnosis not present

## 2018-01-03 DIAGNOSIS — F17210 Nicotine dependence, cigarettes, uncomplicated: Secondary | ICD-10-CM | POA: Insufficient documentation

## 2018-01-03 DIAGNOSIS — M797 Fibromyalgia: Secondary | ICD-10-CM | POA: Diagnosis not present

## 2018-01-03 DIAGNOSIS — K8020 Calculus of gallbladder without cholecystitis without obstruction: Secondary | ICD-10-CM | POA: Diagnosis not present

## 2018-01-03 DIAGNOSIS — R1013 Epigastric pain: Secondary | ICD-10-CM | POA: Diagnosis present

## 2018-01-03 DIAGNOSIS — Z79899 Other long term (current) drug therapy: Secondary | ICD-10-CM | POA: Diagnosis not present

## 2018-01-03 LAB — CBC
HCT: 48 % — ABNORMAL HIGH (ref 36.0–46.0)
HEMOGLOBIN: 16 g/dL — AB (ref 12.0–15.0)
MCH: 29.9 pg (ref 26.0–34.0)
MCHC: 33.3 g/dL (ref 30.0–36.0)
MCV: 89.6 fL (ref 78.0–100.0)
Platelets: 194 10*3/uL (ref 150–400)
RBC: 5.36 MIL/uL — ABNORMAL HIGH (ref 3.87–5.11)
RDW: 13.2 % (ref 11.5–15.5)
WBC: 11.7 10*3/uL — ABNORMAL HIGH (ref 4.0–10.5)

## 2018-01-03 LAB — COMPREHENSIVE METABOLIC PANEL
ALT: 18 U/L (ref 0–44)
ANION GAP: 10 (ref 5–15)
AST: 24 U/L (ref 15–41)
Albumin: 4 g/dL (ref 3.5–5.0)
Alkaline Phosphatase: 74 U/L (ref 38–126)
BUN: 12 mg/dL (ref 6–20)
CALCIUM: 9.3 mg/dL (ref 8.9–10.3)
CO2: 25 mmol/L (ref 22–32)
Chloride: 104 mmol/L (ref 98–111)
Creatinine, Ser: 0.84 mg/dL (ref 0.44–1.00)
GFR calc Af Amer: >60 mL/min (ref >60)
GLUCOSE: 103 mg/dL — AB (ref 70–99)
Potassium: 3.4 mmol/L — ABNORMAL LOW (ref 3.5–5.1)
Sodium: 139 mmol/L (ref 135–145)
Total Bilirubin: 0.5 mg/dL (ref 0.3–1.2)
Total Protein: 7 g/dL (ref 6.5–8.1)

## 2018-01-03 LAB — URINALYSIS, ROUTINE W REFLEX MICROSCOPIC
Glucose, UA: NEGATIVE mg/dL
HGB URINE DIPSTICK: NEGATIVE
Ketones, ur: NEGATIVE mg/dL
Leukocytes, UA: NEGATIVE
Nitrite: NEGATIVE
Protein, ur: NEGATIVE mg/dL
SPECIFIC GRAVITY, URINE: 1.024 (ref 1.005–1.030)
pH: 6 (ref 5.0–8.0)

## 2018-01-03 LAB — I-STAT BETA HCG BLOOD, ED (MC, WL, AP ONLY): I-stat hCG, quantitative: <5 m[IU]/mL (ref <5)

## 2018-01-03 LAB — LIPASE, BLOOD: LIPASE: 37 U/L (ref 11–51)

## 2018-01-03 MED ORDER — ONDANSETRON HCL 4 MG/2ML IJ SOLN
4.0000 mg | Freq: Once | INTRAMUSCULAR | Status: AC
  Administered 2018-01-03: 4 mg via INTRAVENOUS
  Filled 2018-01-03: qty 2

## 2018-01-03 MED ORDER — HYDROCODONE-ACETAMINOPHEN 5-325 MG PO TABS
1.0000 | ORAL_TABLET | ORAL | 0 refills | Status: DC | PRN
Start: 2018-01-03 — End: 2018-01-14

## 2018-01-03 MED ORDER — ONDANSETRON 4 MG PO TBDP: 4.0000 mg | ORAL_TABLET | Freq: Three times a day (TID) | ORAL | 0 refills | Status: DC | PRN

## 2018-01-03 MED ORDER — MORPHINE SULFATE (PF) 4 MG/ML IV SOLN
4.0000 mg | Freq: Once | INTRAVENOUS | Status: AC
  Administered 2018-01-03: 4 mg via INTRAVENOUS
  Filled 2018-01-03: qty 1

## 2018-01-03 MED ORDER — GI COCKTAIL ~~LOC~~
30.0000 mL | Freq: Once | ORAL | Status: AC
  Administered 2018-01-03: 30 mL via ORAL
  Filled 2018-01-03: qty 30

## 2018-01-03 MED ORDER — VENLAFAXINE HCL ER 75 MG PO CP24: 75.0000 mg | ORAL_CAPSULE | Freq: Every day | ORAL | 5 refills | Status: DC

## 2018-01-03 NOTE — ED Triage Notes (Signed)
Patient here with sharp abdominal pain on the right quadrant.  She states that she feels like someone is squeezing her insides.  She denies any nausea or vomiting at this time.

## 2018-01-03 NOTE — ED Notes (Signed)
  Patient had large emesis episode around 0250.  MD notified and patient given IV zofran.

## 2018-01-03 NOTE — ED Provider Notes (Signed)
[facility] EMERGENCY DEPARTMENT Provider Note   CSN: [id number] Arrival date & time: 01/03/18  0108     History   Chief Complaint Chief Complaint  Patient presents with  . Abdominal Pain    HPI Kelli Washington is a 37 y.o. female.  Patient presents to the emergency department with a chief complaint of epigastric abdominal pain and vomiting.  She states the symptoms started suddenly this evening.  She states that she does have some pain that radiates over to her right upper abdomen.  She denies any postprandial symptoms.  Denies any alcohol use tonight.  Her symptoms are worsened with palpation.  She denies any fevers chills.  Denies any dysuria, hematuria.  Denies diarrhea.  The history is provided by the patient. No language interpreter was used.    Past Medical History:  Diagnosis Date  . Anxiety   . Arthritis   . Asthma   . Depression   . Dysmenorrhea   . Fibromyalgia 2016  . Vaginal delivery 1999,2002,2010,2013    There are no active problems to display for this patient.   Past Surgical History:  Procedure Laterality Date  . CERVICAL BIOPSY  W/ LOOP ELECTRODE EXCISION    . COLPOSCOPY    . TUBAL LIGATION    . WISDOM TOOTH EXTRACTION  2006     OB History    Gravida  4   Para  4   Term  4   Preterm      AB      Living  4     SAB      TAB      Ectopic      Multiple      Live Births  4            Home Medications    Prior to Admission medications   Medication Sig Start Date End Date Taking? Authorizing Provider  metroNIDAZOLE (FLAGYL) 500 MG tablet Take 1 tablet (500 mg total) by mouth 2 (two) times daily. Patient not taking: Reported on 12/29/2017 12/07/17   Romualdo Bolk, MD  venlafaxine XR (EFFEXOR-XR) 37.5 MG 24 hr capsule Take 1 capsule (37.5 mg total) by mouth daily with breakfast. 11/30/17   Orland Mustard, MD    Family History Family History  Problem Relation Age of Onset  . Arthritis Mother   .  Asthma Mother   . Cancer Mother        liver  . Depression Mother   . Drug abuse Mother   . Early death Mother   . Drug abuse Father   . Heart attack Father   . Heart disease Father   . Kidney disease Father   . Stroke Father   . Stroke Maternal Grandmother     Social History Social History   Tobacco Use  . Smoking status: Current Every Day Smoker    Packs/day: 1.00    Types: Cigarettes  . Smokeless tobacco: Never Used  Substance Use Topics  . Alcohol use: Yes    Alcohol/week: 0.6 - 1.2 oz    Types: 1 - 2 Standard drinks or equivalent per week  . Drug use: Never     Allergies   Patient has no known allergies.   Review of Systems Review of Systems  All other systems reviewed and are negative.    Physical Exam Updated Vital Signs BP 128/76   Pulse 66   Temp (!) 97.5 F (36.4 C) (Oral)   Resp 20  LMP 12/28/2017 (Exact Date) Comment: tubal  SpO2 96%   Physical Exam  Constitutional: She is oriented to person, place, and time. She appears well-developed and well-nourished.  HENT:  Head: Normocephalic and atraumatic.  Eyes: Pupils are equal, round, and reactive to light. Conjunctivae and EOM are normal.  Neck: Normal range of motion. Neck supple.  Cardiovascular: Normal rate and regular rhythm. Exam reveals no gallop and no friction rub.  No murmur heard. Pulmonary/Chest: Effort normal and breath sounds normal. No respiratory distress. She has no wheezes. She has no rales. She exhibits no tenderness.  Abdominal: Soft. Bowel sounds are normal. She exhibits no distension and no mass. There is tenderness in the right upper quadrant and epigastric area. There is no rebound and no guarding.  Musculoskeletal: Normal range of motion. She exhibits no edema or tenderness.  Neurological: She is alert and oriented to person, place, and time.  Skin: Skin is warm and dry.  Psychiatric: She has a normal mood and affect. Her behavior is normal. Judgment and thought content  normal.  Nursing note and vitals reviewed.    ED Treatments / Results  Labs (all labs ordered are listed, but only abnormal results are displayed) Labs Reviewed  COMPREHENSIVE METABOLIC PANEL - Abnormal; Notable for the following components:      Result Value   Potassium 3.4 (*)    Glucose, Bld 103 (*)    All other components within normal limits  CBC - Abnormal; Notable for the following components:   WBC 11.7 (*)    RBC 5.36 (*)    Hemoglobin 16.0 (*)    HCT 48.0 (*)    All other components within normal limits  URINALYSIS, ROUTINE W REFLEX MICROSCOPIC - Abnormal; Notable for the following components:   Bilirubin Urine SMALL (*)    All other components within normal limits  LIPASE, BLOOD  I-STAT BETA HCG BLOOD, ED ([facility], [facility], [facility] ONLY)    EKG None  Radiology No results found.  Procedures Procedures (including critical care time)  Medications Ordered in ED Medications  morphine 4 MG/ML injection 4 mg (has no administration in time range)  ondansetron (ZOFRAN) injection 4 mg (has no administration in time range)  gi cocktail (Maalox,Lidocaine,Donnatal) (has no administration in time range)  ondansetron (ZOFRAN) injection 4 mg (4 mg Intravenous Given 01/03/18 0302)     Initial Impression / Assessment and Plan / ED Course  I have reviewed the triage vital signs and the nursing notes.  Pertinent labs & imaging results that were available during my care of the patient were reviewed by me and considered in my medical decision making (see chart for details).     Patient with sudden onset epigastric and right upper quadrant pain.  She does have some tenderness in the right upper quadrant, but more so in the epigastrium.  Laboratory work-up is fairly reassuring.  She does have mild leukocytosis.  Will treat with morphine, Zofran, GI cocktail, and will check right upper quadrant ultrasound to rule out cholecystitis.  Ultrasound shows cholelithiasis without evidence of  cholecystitis.  Pain is well controlled.  Will discharge home with Vicodin and Zofran.  Recommend general surgery follow-up.  Final Clinical Impressions(s) / ED Diagnoses   Final diagnoses:  Calculus of gallbladder without cholecystitis without obstruction    ED Discharge Orders        Ordered    HYDROcodone-acetaminophen (NORCO/VICODIN) 5-325 MG tablet  Every 4 hours PRN     01/03/18 0545    ondansetron (  ZOFRAN ODT) 4 MG disintegrating tablet  Every 8 hours PRN     01/03/18 0545       Roxy HorsemanBrowning, Jojuan Champney, PA-C 01/03/18 [id number]    Zadie RhineWickline, Donald, MD 01/03/18 [phone]

## 2018-01-03 NOTE — Progress Notes (Signed)
Patient: Kelli Washington MRN: [id number] DOB: 01-Sep-1980 PCP: Orland Mustard, MD     Subjective:  Chief Complaint  Patient presents with  . Follow-up  . ER for abdominal pain 6/27    HPI: The patient is a 37 y.o. female who presents today for follow up for fibromylagia. We started her on effexor at last visit one month ago. She doesn't feel any bad effects of effexor, but can't tell a big difference in the fibro pain. She has not tried the CBD oil yet either as it's more expensive. She is wanting to stay on medication a little longer. Again, she has failed every other therapy for fibro treatment. Has not started exercising regularly.   Seen in ER for stomach pain early this AM. Found to have gallstones and referral to surgery already done. She was given diet to follow. Has had issues for years, but first time she thinks it's the first time she has had gallstones. She was given vicodin and zofran. Pain controlled.   Review of Systems  Constitutional: Positive for appetite change and fatigue. Negative for fever.  Respiratory: Negative for shortness of breath.   Cardiovascular: Negative for chest pain and leg swelling.  Gastrointestinal: Positive for abdominal pain, nausea and vomiting.  Genitourinary: Negative for dysuria, frequency and urgency.  Musculoskeletal: Positive for back pain and neck pain.  Neurological: Negative for dizziness and headaches.  Psychiatric/Behavioral: The patient is not nervous/anxious.     Allergies Patient has No Known Allergies.  Past Medical History Patient  has a past medical history of Anxiety, Arthritis, Asthma, Depression, Dysmenorrhea, Fibromyalgia (2016), and Vaginal delivery (1999,2002,2010,2013).  Surgical History Patient  has a past surgical history that includes Tubal ligation; Wisdom tooth extraction (2006); Cervical biopsy w/ loop electrode excision; and Colposcopy.  Family History Pateint's family history includes Arthritis in her  mother; Asthma in her mother; Cancer in her mother; Depression in her mother; Drug abuse in her father and mother; Early death in her mother; Heart attack in her father; Heart disease in her father; Kidney disease in her father; Stroke in her father and maternal grandmother.  Social History Patient  reports that she has been smoking cigarettes.  She has been smoking about 1.00 pack per day. She has never used smokeless tobacco. She reports that she drinks about 0.6 - 1.2 oz of alcohol per week. She reports that she does not use drugs.    Objective: Vitals:   01/03/18 1402 01/03/18 1409  BP: (!) 82/64 102/72  Pulse: 73   Temp: 98.3 F (36.8 C)   TempSrc: Oral   SpO2: 97%   Weight: 161 lb 3.2 oz (73.1 kg)   Height: 5\' 5"  (1.651 m)     Body mass index is 26.83 kg/m.  Physical Exam  Constitutional: She appears well-developed and well-nourished.  Tobacco odor   Neck: Normal range of motion. Neck supple.  Cardiovascular: Normal rate, regular rhythm and normal heart sounds.  Pulmonary/Chest: Effort normal and breath sounds normal.  Abdominal: Soft. Bowel sounds are normal. There is tenderness (+murphy's sign ). There is no rebound and no guarding.  Lymphadenopathy:    She has no cervical adenopathy.  Vitals reviewed.      Assessment/plan: 1. Fibromyalgia Continue effexor. Increasing dose to 75mg  and will see how she does. It has helped her energy level. Will let me know how she is doing on this. Any issues she is to let me know.   2. Calculus of gallbladder without cholecystitis without obstruction F/u  with surgery, likely needs removed. Already has referral. Is calling today for appointment. zofran /vicodin prn. Discussed proper diet as well. Precautions given.       Return if symptoms worsen or fail to improve.     Orland MustardAllison Wolfe, MD [place] [facility]   01/03/2018

## 2018-01-03 NOTE — ED Notes (Signed)
Patient had IUD taken out about one month ago, history of tubal ligation.

## 2018-01-08 ENCOUNTER — Ambulatory Visit: Payer: Self-pay | Admitting: General Surgery

## 2018-01-12 ENCOUNTER — Encounter (HOSPITAL_COMMUNITY): Payer: Self-pay | Admitting: Emergency Medicine

## 2018-01-12 ENCOUNTER — Emergency Department (HOSPITAL_COMMUNITY): Payer: Managed Care, Other (non HMO)

## 2018-01-12 ENCOUNTER — Inpatient Hospital Stay (HOSPITAL_COMMUNITY)
Admission: EM | Admit: 2018-01-12 | Discharge: 2018-01-14 | DRG: 419 | Disposition: A | Discharge status: Home / Self Care | Payer: Managed Care, Other (non HMO) | Attending: Internal Medicine | Admitting: Internal Medicine

## 2018-01-12 ENCOUNTER — Other Ambulatory Visit: Payer: Self-pay

## 2018-01-12 DIAGNOSIS — K851 Biliary acute pancreatitis without necrosis or infection: Secondary | ICD-10-CM

## 2018-01-12 DIAGNOSIS — R945 Abnormal results of liver function studies: Secondary | ICD-10-CM | POA: Diagnosis not present

## 2018-01-12 DIAGNOSIS — J45909 Unspecified asthma, uncomplicated: Secondary | ICD-10-CM | POA: Diagnosis present

## 2018-01-12 DIAGNOSIS — Z01818 Encounter for other preprocedural examination: Secondary | ICD-10-CM

## 2018-01-12 DIAGNOSIS — K8070 Calculus of gallbladder and bile duct without cholecystitis without obstruction: Secondary | ICD-10-CM | POA: Diagnosis present

## 2018-01-12 DIAGNOSIS — K8020 Calculus of gallbladder without cholecystitis without obstruction: Secondary | ICD-10-CM

## 2018-01-12 DIAGNOSIS — M797 Fibromyalgia: Secondary | ICD-10-CM | POA: Diagnosis present

## 2018-01-12 DIAGNOSIS — R52 Pain, unspecified: Secondary | ICD-10-CM

## 2018-01-12 DIAGNOSIS — D72829 Elevated white blood cell count, unspecified: Secondary | ICD-10-CM | POA: Diagnosis present

## 2018-01-12 DIAGNOSIS — R1011 Right upper quadrant pain: Secondary | ICD-10-CM

## 2018-01-12 DIAGNOSIS — F17210 Nicotine dependence, cigarettes, uncomplicated: Secondary | ICD-10-CM | POA: Diagnosis present

## 2018-01-12 DIAGNOSIS — R7989 Other specified abnormal findings of blood chemistry: Secondary | ICD-10-CM | POA: Diagnosis present

## 2018-01-12 LAB — I-STAT BETA HCG BLOOD, ED (MC, WL, AP ONLY)

## 2018-01-12 LAB — COMPREHENSIVE METABOLIC PANEL
ALT: 76 U/L — ABNORMAL HIGH (ref 0–44)
ANION GAP: 9 (ref 5–15)
AST: 100 U/L — ABNORMAL HIGH (ref 15–41)
Albumin: 4 g/dL (ref 3.5–5.0)
Alkaline Phosphatase: 81 U/L (ref 38–126)
BILIRUBIN TOTAL: 1 mg/dL (ref 0.3–1.2)
BUN: 15 mg/dL (ref 6–20)
CO2: 27 mmol/L (ref 22–32)
Calcium: 9.2 mg/dL (ref 8.9–10.3)
Chloride: 102 mmol/L (ref 98–111)
Creatinine, Ser: 0.87 mg/dL (ref 0.44–1.00)
GFR calc Af Amer: >60 mL/min (ref >60)
Glucose, Bld: 109 mg/dL — ABNORMAL HIGH (ref 70–99)
POTASSIUM: 3.8 mmol/L (ref 3.5–5.1)
Sodium: 138 mmol/L (ref 135–145)
TOTAL PROTEIN: 7.2 g/dL (ref 6.5–8.1)

## 2018-01-12 LAB — URINALYSIS, ROUTINE W REFLEX MICROSCOPIC
Bilirubin Urine: NEGATIVE
Glucose, UA: NEGATIVE mg/dL
Hgb urine dipstick: NEGATIVE
KETONES UR: 5 mg/dL — AB
LEUKOCYTES UA: NEGATIVE
NITRITE: NEGATIVE
PH: 5 (ref 5.0–8.0)
Protein, ur: NEGATIVE mg/dL
Specific Gravity, Urine: 1.025 (ref 1.005–1.030)

## 2018-01-12 LAB — CBC
HCT: 46.2 % — ABNORMAL HIGH (ref 36.0–46.0)
HEMOGLOBIN: 15.9 g/dL — AB (ref 12.0–15.0)
MCH: 30.8 pg (ref 26.0–34.0)
MCHC: 34.4 g/dL (ref 30.0–36.0)
MCV: 89.5 fL (ref 78.0–100.0)
Platelets: 177 10*3/uL (ref 150–400)
RBC: 5.16 MIL/uL — ABNORMAL HIGH (ref 3.87–5.11)
RDW: 13.3 % (ref 11.5–15.5)
WBC: 13 10*3/uL — AB (ref 4.0–10.5)

## 2018-01-12 LAB — LIPASE, BLOOD: Lipase: 1187 U/L — ABNORMAL HIGH (ref 11–51)

## 2018-01-12 MED ORDER — GADOBENATE DIMEGLUMINE 529 MG/ML IV SOLN
15.0000 mL | Freq: Once | INTRAVENOUS | Status: AC | PRN
  Administered 2018-01-12: 15 mL via INTRAVENOUS

## 2018-01-12 MED ORDER — SODIUM CHLORIDE 0.9 % IV SOLN
INTRAVENOUS | Status: DC
  Administered 2018-01-12 – 2018-01-13 (×5): via INTRAVENOUS

## 2018-01-12 MED ORDER — HYDROMORPHONE HCL 1 MG/ML IJ SOLN
1.0000 mg | Freq: Once | INTRAMUSCULAR | Status: AC
  Administered 2018-01-12: 1 mg via INTRAVENOUS
  Filled 2018-01-12: qty 1

## 2018-01-12 MED ORDER — ACETAMINOPHEN 650 MG RE SUPP: 650.0000 mg | Freq: Four times a day (QID) | RECTAL | Status: DC | PRN

## 2018-01-12 MED ORDER — SODIUM CHLORIDE 0.9 % IV BOLUS
1000.0000 mL | Freq: Once | INTRAVENOUS | Status: AC
  Administered 2018-01-12: 1000 mL via INTRAVENOUS

## 2018-01-12 MED ORDER — PIPERACILLIN-TAZOBACTAM 3.375 G IVPB 30 MIN
3.3750 g | Freq: Once | INTRAVENOUS | Status: AC
  Administered 2018-01-12: 3.375 g via INTRAVENOUS
  Filled 2018-01-12: qty 50

## 2018-01-12 MED ORDER — ONDANSETRON HCL 4 MG/2ML IJ SOLN
4.0000 mg | Freq: Once | INTRAMUSCULAR | Status: AC
  Administered 2018-01-12: 4 mg via INTRAVENOUS
  Filled 2018-01-12: qty 2

## 2018-01-12 MED ORDER — ONDANSETRON HCL 4 MG/2ML IJ SOLN
4.0000 mg | Freq: Four times a day (QID) | INTRAMUSCULAR | Status: DC | PRN
  Administered 2018-01-13: 4 mg via INTRAVENOUS
  Filled 2018-01-12: qty 2

## 2018-01-12 MED ORDER — HYDROMORPHONE HCL 1 MG/ML IJ SOLN
0.5000 mg | INTRAMUSCULAR | Status: DC | PRN
  Administered 2018-01-13 (×2): 0.5 mg via INTRAVENOUS
  Filled 2018-01-12 (×2): qty 0.5

## 2018-01-12 MED ORDER — ONDANSETRON HCL 4 MG PO TABS: 4.0000 mg | ORAL_TABLET | Freq: Four times a day (QID) | ORAL | Status: DC | PRN

## 2018-01-12 MED ORDER — SODIUM CHLORIDE 0.9 % IV SOLN: Freq: Once | INTRAVENOUS | Status: DC

## 2018-01-12 MED ORDER — VENLAFAXINE HCL ER 75 MG PO CP24
75.0000 mg | ORAL_CAPSULE | Freq: Every day | ORAL | Status: DC
  Administered 2018-01-13 – 2018-01-14 (×2): 75 mg via ORAL
  Filled 2018-01-12 (×2): qty 1

## 2018-01-12 MED ORDER — ACETAMINOPHEN 325 MG PO TABS: 650.0000 mg | ORAL_TABLET | Freq: Four times a day (QID) | ORAL | Status: DC | PRN

## 2018-01-12 MED ORDER — SODIUM CHLORIDE 0.9 % IV BOLUS: 1000.0000 mL | Freq: Once | INTRAVENOUS | Status: DC

## 2018-01-12 NOTE — ED Notes (Signed)
Patient returned from MRI.

## 2018-01-12 NOTE — ED Triage Notes (Addendum)
Patient complaining of upper abdominal pain, nausea, and vomiting. Patient has had some alcohol tonight. Patient dx with gall stones. Patient has followed up with surgery. Patient is here because she ran out of her pain medication.

## 2018-01-12 NOTE — ED Notes (Signed)
ED TO INPATIENT HANDOFF REPORT  Name/Age/Gender Kelli Washington 37 y.o. female  Code Status    Code Status Orders  (From admission, onward)        Start     Ordered   01/12/18 0814  Full code  Continuous     01/12/18 0814    Code Status History    This patient has a current code status but no historical code status.      Home/SNF/Other Home  Chief Complaint abdominal pain  Level of Care/Admitting Diagnosis ED Disposition    ED Disposition Condition Comment   Admit  Hospital Area: [facility] [[id number]]  Level of Care: Med-Surg [16]  Diagnosis: Acute gallstone pancreatitis [9449675]  Admitting Physician: Aline August [[id number]]  Attending Physician: Aline August [[id number]]  Estimated length of stay: 3 - 4 days  Certification:: I certify this patient will need inpatient services for at least 2 midnights  PT Class (Do Not Modify): Inpatient [101]  PT Acc Code (Do Not Modify): Private [1]       Medical History Past Medical History:  Diagnosis Date  . Anxiety   . Arthritis   . Asthma   . Depression   . Dysmenorrhea   . Fibromyalgia 2016  . Vaginal delivery 1999,2002,2010,2013    Allergies No Known Allergies  IV Location/Drains/Wounds Patient Lines/Drains/Airways Status   Active Line/Drains/Airways    Name:   Placement date:   Placement time:   Site:   Days:   Peripheral IV 01/12/18 Left Antecubital   01/12/18    0544    Antecubital   less than 1          Labs/Imaging Results for orders placed or performed during the hospital encounter of 01/12/18 (from the past 48 hour(s))  Urinalysis, Routine w reflex microscopic     Status: Abnormal   Collection Time: 01/12/18  3:14 AM  Result Value Ref Range   Color, Urine YELLOW YELLOW   APPearance CLEAR CLEAR   Specific Gravity, Urine 1.025 1.005 - 1.030   pH 5.0 5.0 - 8.0   Glucose, UA NEGATIVE NEGATIVE mg/dL   Hgb urine dipstick NEGATIVE NEGATIVE   Bilirubin Urine NEGATIVE  NEGATIVE   Ketones, ur 5 (A) NEGATIVE mg/dL   Protein, ur NEGATIVE NEGATIVE mg/dL   Nitrite NEGATIVE NEGATIVE   Leukocytes, UA NEGATIVE NEGATIVE    Comment: Performed at [facility], [place] [address] [place], [place] [address]  Lipase, blood     Status: Abnormal   Collection Time: 01/12/18  5:30 AM  Result Value Ref Range   Lipase 1,187 (H) 11 - 51 U/L    Comment: RESULTS CONFIRMED BY MANUAL DILUTION Performed at [facility], [place] [address] [place],  [address]   Comprehensive metabolic panel     Status: Abnormal   Collection Time: 01/12/18  5:30 AM  Result Value Ref Range   Sodium 138 135 - 145 mmol/L   Potassium 3.8 3.5 - 5.1 mmol/L   Chloride 102 98 - 111 mmol/L    Comment: Please note change in reference range.   CO2 27 22 - 32 mmol/L   Glucose, Bld 109 (H) 70 - 99 mg/dL    Comment: Please note change in reference range.   BUN 15 6 - 20 mg/dL    Comment: Please note change in reference range.   Creatinine, Ser 0.87 0.44 - 1.00 mg/dL   Calcium 9.2 8.9 - 10.3 mg/dL   Total Protein 7.2 6.5 -  8.1 g/dL   Albumin 4.0 3.5 - 5.0 g/dL   AST 100 (H) 15 - 41 U/L   ALT 76 (H) 0 - 44 U/L    Comment: Please note change in reference range.   Alkaline Phosphatase 81 38 - 126 U/L   Total Bilirubin 1.0 0.3 - 1.2 mg/dL   GFR calc non Af Amer >60 >60 mL/min   GFR calc Af Amer >60 >60 mL/min    Comment: (NOTE) The eGFR has been calculated using the CKD EPI equation. This calculation has not been validated in all clinical situations. eGFR's persistently <60 mL/min signify possible Chronic Kidney Disease.    Anion gap 9 5 - 15    Comment: Performed at [facility], [place] [address] [place], [place] [address]  CBC     Status: Abnormal   Collection Time: 01/12/18  5:30 AM  Result Value Ref Range   WBC 13.0 (H) 4.0 - 10.5 K/uL   RBC 5.16 (H) 3.87 - 5.11 MIL/uL   Hemoglobin 15.9 (H) 12.0 - 15.0 g/dL   HCT 46.2 (H) 36.0 - 46.0  %   MCV 89.5 78.0 - 100.0 fL   MCH 30.8 26.0 - 34.0 pg   MCHC 34.4 30.0 - 36.0 g/dL   RDW 13.3 11.5 - 15.5 %   Platelets 177 150 - 400 K/uL    Comment: Performed at [facility], [place] [address] [place], [place] [address]  I-Stat beta hCG blood, ED     Status: None   Collection Time: 01/12/18  5:55 AM  Result Value Ref Range   I-stat hCG, quantitative <5.0 <5 mIU/mL   Comment 3            Comment:   GEST. AGE      CONC.  (mIU/mL)   <=1 WEEK        5 - 50     2 WEEKS       50 - 500     3 WEEKS       100 - 10,000     4 WEEKS     1,000 - 30,000        FEMALE AND NON-PREGNANT FEMALE:     LESS THAN 5 mIU/mL    Mr 3d Recon At Scanner  Result Date: 01/12/2018 CLINICAL DATA:  Right upper quadrant abdominal pain. Cholelithiasis at sonography. EXAM: MRI ABDOMEN WITHOUT AND WITH CONTRAST (INCLUDING MRCP) TECHNIQUE: Multiplanar multisequence MR imaging of the abdomen was performed both before and after the administration of intravenous contrast. Heavily T2-weighted images of the biliary and pancreatic ducts were obtained, and three-dimensional MRCP images were rendered by post processing. CONTRAST:  43m MULTIHANCE GADOBENATE DIMEGLUMINE 529 MG/ML IV SOLN COMPARISON:  01/12/2018 abdominal sonogram. FINDINGS: Significantly motion degraded scan particularly on the postcontrast images, limiting assessment. Lower chest: No acute abnormality at the lung bases. Hepatobiliary: Normal liver size and configuration. No significant hepatic steatosis. No liver mass. Multiple subcentimeter gallstones layering in the gallbladder. No significant gallbladder distention. No definite gallbladder wall thickening. No pericholecystic fluid. No biliary ductal dilatation. Common bile duct diameter 6 mm. No evidence of choledocholithiasis. No biliary strictures or masses. Pancreas: No pancreatic mass or duct dilation.  No pancreas divisum. Spleen: Normal size. No mass. Adrenals/Urinary Tract: Normal adrenals. No  hydronephrosis. Normal kidneys with no renal mass. Stomach/Bowel: Normal non-distended stomach. Visualized small and large bowel is normal caliber, with no bowel wall thickening. Vascular/Lymphatic: Normal caliber abdominal aorta. Patent portal, splenic, hepatic  and renal veins. No pathologically enlarged lymph nodes in the abdomen. Other: No abdominal ascites or focal fluid collection. Musculoskeletal: No aggressive appearing focal osseous lesions. IMPRESSION: 1. Cholelithiasis.  No MRI findings of acute cholecystitis. 2. No intrahepatic biliary ductal dilatation. CBD diameter 6 mm, top-normal. No choledocholithiasis. Electronically Signed   By: Ilona Sorrel M.D.   On: 01/12/2018 09:20   Mr Abdomen Mrcp Moise Boring Contast  Result Date: 01/12/2018 CLINICAL DATA:  Right upper quadrant abdominal pain. Cholelithiasis at sonography. EXAM: MRI ABDOMEN WITHOUT AND WITH CONTRAST (INCLUDING MRCP) TECHNIQUE: Multiplanar multisequence MR imaging of the abdomen was performed both before and after the administration of intravenous contrast. Heavily T2-weighted images of the biliary and pancreatic ducts were obtained, and three-dimensional MRCP images were rendered by post processing. CONTRAST:  71m MULTIHANCE GADOBENATE DIMEGLUMINE 529 MG/ML IV SOLN COMPARISON:  01/12/2018 abdominal sonogram. FINDINGS: Significantly motion degraded scan particularly on the postcontrast images, limiting assessment. Lower chest: No acute abnormality at the lung bases. Hepatobiliary: Normal liver size and configuration. No significant hepatic steatosis. No liver mass. Multiple subcentimeter gallstones layering in the gallbladder. No significant gallbladder distention. No definite gallbladder wall thickening. No pericholecystic fluid. No biliary ductal dilatation. Common bile duct diameter 6 mm. No evidence of choledocholithiasis. No biliary strictures or masses. Pancreas: No pancreatic mass or duct dilation.  No pancreas divisum. Spleen: Normal  size. No mass. Adrenals/Urinary Tract: Normal adrenals. No hydronephrosis. Normal kidneys with no renal mass. Stomach/Bowel: Normal non-distended stomach. Visualized small and large bowel is normal caliber, with no bowel wall thickening. Vascular/Lymphatic: Normal caliber abdominal aorta. Patent portal, splenic, hepatic and renal veins. No pathologically enlarged lymph nodes in the abdomen. Other: No abdominal ascites or focal fluid collection. Musculoskeletal: No aggressive appearing focal osseous lesions. IMPRESSION: 1. Cholelithiasis.  No MRI findings of acute cholecystitis. 2. No intrahepatic biliary ductal dilatation. CBD diameter 6 mm, top-normal. No choledocholithiasis. Electronically Signed   By: JIlona SorrelM.D.   On: 01/12/2018 09:20   UKoreaAbdomen Limited Ruq  Result Date: 01/12/2018 CLINICAL DATA:  Right upper quadrant pain. EXAM: ULTRASOUND ABDOMEN LIMITED RIGHT UPPER QUADRANT COMPARISON:  January 03, 2018 FINDINGS: Gallbladder: Cholelithiasis is identified. The largest stone is seen in the neck of the gallbladder measuring 1.1 cm. Sludge is identified as well. No Murphy's sign, pericholecystic fluid, or wall thickening. Common bile duct: Diameter: 5.1 mm Liver: No focal lesion identified. Within normal limits in parenchymal echogenicity. Portal vein is patent on color Doppler imaging with normal direction of blood flow towards the liver. IMPRESSION: 1. Cholelithiasis and sludge in the gallbladder as above with no wall thickening, pericholecystic fluid, or Murphy's sign. If concern persists, a HIDA scan could further evaluate. Electronically Signed   By: DDorise BullionIII M.D   On: 01/12/2018 07:29    Pending Labs Unresulted Labs (From admission, onward)   Start     Ordered   01/13/18 0500  CBC  Tomorrow morning,   R     01/12/18 0814   01/13/18 0500  Comprehensive metabolic panel  Tomorrow morning,   R     01/12/18 0814      Vitals/Pain Today's Vitals   01/12/18 0520 01/12/18 0634  01/12/18 0634 01/12/18 0747  BP: 108/63   99/60  Pulse: (!) 54   (!) 51  Resp: 16   18  Temp:      TempSrc:      SpO2: 98%   99%  Weight:      Height:  PainSc:  1  1      Isolation Precautions No active isolations  Medications Medications  0.9 %  sodium chloride infusion (has no administration in time range)  venlafaxine XR (EFFEXOR-XR) 24 hr capsule 75 mg (has no administration in time range)  acetaminophen (TYLENOL) tablet 650 mg (has no administration in time range)    Or  acetaminophen (TYLENOL) suppository 650 mg (has no administration in time range)  ondansetron (ZOFRAN) tablet 4 mg (has no administration in time range)    Or  ondansetron (ZOFRAN) injection 4 mg (has no administration in time range)  HYDROmorphone (DILAUDID) injection 0.5 mg (has no administration in time range)  sodium chloride 0.9 % bolus 1,000 mL (0 mLs Intravenous Stopped 01/12/18 0711)  HYDROmorphone (DILAUDID) injection 1 mg (1 mg Intravenous Given 01/12/18 0552)  ondansetron (ZOFRAN) injection 4 mg (4 mg Intravenous Given 01/12/18 0552)  piperacillin-tazobactam (ZOSYN) IVPB 3.375 g (0 g Intravenous Stopped 01/12/18 0837)  sodium chloride 0.9 % bolus 1,000 mL (1,000 mLs Intravenous New Bag/Given 01/12/18 0749)  gadobenate dimeglumine (MULTIHANCE) injection 15 mL (15 mLs Intravenous Contrast Given 01/12/18 0857)    Mobility walks

## 2018-01-12 NOTE — Consult Note (Signed)
Chief Complaint:  Abdominal pain radiating into the back  History of Present Illness:  Kelli Washington is an 37 y.o. female who presented to [facility] last night after onset of pain about 11 pm.  She was seen by Dr. Hulen Skains last week and lap chole was discussed.  This time the pain radiated into her back and was associated with a lipase of 1100.  She is still having pain but it is better.    Past Medical History:  Diagnosis Date  . Anxiety   . Arthritis   . Asthma   . Depression   . Dysmenorrhea   . Fibromyalgia 2016  . Vaginal delivery 1999,2002,2010,2013    Past Surgical History:  Procedure Laterality Date  . CERVICAL BIOPSY  W/ LOOP ELECTRODE EXCISION    . COLPOSCOPY    . TUBAL LIGATION    . WISDOM TOOTH EXTRACTION  2006    Current Facility-Administered Medications  Medication Dose Route Frequency Provider Last Rate Last Dose  . 0.9 %  sodium chloride infusion   Intravenous Continuous Starla Link, Kshitiz, MD 200 mL/hr at 01/12/18 1000    . acetaminophen (TYLENOL) tablet 650 mg  650 mg Oral Q6H PRN Aline August, MD       Or  . acetaminophen (TYLENOL) suppository 650 mg  650 mg Rectal Q6H PRN Alekh, Kshitiz, MD      . HYDROmorphone (DILAUDID) injection 0.5 mg  0.5 mg Intravenous Q3H PRN Starla Link, Kshitiz, MD      . ondansetron (ZOFRAN) tablet 4 mg  4 mg Oral Q6H PRN Starla Link, Kshitiz, MD       Or  . ondansetron (ZOFRAN) injection 4 mg  4 mg Intravenous Q6H PRN Aline August, MD      . Derrill Memo ON 01/13/2018] venlafaxine XR (EFFEXOR-XR) 24 hr capsule 75 mg  75 mg Oral Q breakfast Aline August, MD       Patient has no known allergies. Family History  Problem Relation Age of Onset  . Arthritis Mother   . Asthma Mother   . Cancer Mother        liver  . Depression Mother   . Drug abuse Mother   . Early death Mother   . Drug abuse Father   . Heart attack Father   . Heart disease Father   . Kidney disease Father   . Stroke Father   . Stroke Maternal Grandmother    Social History:    reports that she has been smoking cigarettes.  She has been smoking about 1.00 pack per day. She has never used smokeless tobacco. She reports that she drinks about 0.6 - 1.2 oz of alcohol per week. She reports that she does not use drugs.   REVIEW OF SYSTEMS : Negative except for her biliary symptoms  Physical Exam:   Blood pressure 98/63, pulse (!) 51, temperature 97.7 F (36.5 C), temperature source Oral, resp. rate 18, height '5\' 5"'  (1.651 m), weight 72.6 kg (160 lb), last menstrual period 12/28/2017, SpO2 100 %. Body mass index is 26.63 kg/m.  Gen:  WDWN WF NAD  Neurological: Alert and oriented to person, place, and time. Motor and sensory function is grossly intact  Head: Normocephalic and atraumatic.  Eyes: Conjunctivae are normal. Pupils are equal, round, and reactive to light. No scleral icterus.  Neck: Normal range of motion. Neck supple. No tracheal deviation or thyromegaly present.  Cardiovascular:  SR without murmurs or gallops.  No carotid bruits Breast:  Not examined Respiratory: Effort normal.  No  respiratory distress. No chest wall tenderness. Breath sounds normal.  No wheezes, rales or rhonchi.  Abdomen:  Pain in the midepigastric radiating into the back GU:  unremarkable Musculoskeletal: Normal range of motion. Extremities are nontender. No cyanosis, edema or clubbing noted Lymphadenopathy: No cervical, preauricular, postauricular or axillary adenopathy is present Skin: Skin is warm and dry. No rash noted. No diaphoresis. No erythema. No pallor. Pscyh: Normal mood and affect. Behavior is normal. Judgment and thought content normal.   LABORATORY RESULTS: Results for orders placed or performed during the hospital encounter of 01/12/18 (from the past 48 hour(s))  Urinalysis, Routine w reflex microscopic     Status: Abnormal   Collection Time: 01/12/18  3:14 AM  Result Value Ref Range   Color, Urine YELLOW YELLOW   APPearance CLEAR CLEAR   Specific Gravity, Urine 1.025  1.005 - 1.030   pH 5.0 5.0 - 8.0   Glucose, UA NEGATIVE NEGATIVE mg/dL   Hgb urine dipstick NEGATIVE NEGATIVE   Bilirubin Urine NEGATIVE NEGATIVE   Ketones, ur 5 (A) NEGATIVE mg/dL   Protein, ur NEGATIVE NEGATIVE mg/dL   Nitrite NEGATIVE NEGATIVE   Leukocytes, UA NEGATIVE NEGATIVE    Comment: Performed at [place] [address], [place], [place] [address]  Lipase, blood     Status: Abnormal   Collection Time: 01/12/18  5:30 AM  Result Value Ref Range   Lipase 1,187 (H) 11 - 51 U/L    Comment: RESULTS CONFIRMED BY MANUAL DILUTION Performed at [facility], [place] [address] [place], [place] [address]   Comprehensive metabolic panel     Status: Abnormal   Collection Time: 01/12/18  5:30 AM  Result Value Ref Range   Sodium 138 135 - 145 mmol/L   Potassium 3.8 3.5 - 5.1 mmol/L   Chloride 102 98 - 111 mmol/L    Comment: Please note change in reference range.   CO2 27 22 - 32 mmol/L   Glucose, Bld 109 (H) 70 - 99 mg/dL    Comment: Please note change in reference range.   BUN 15 6 - 20 mg/dL    Comment: Please note change in reference range.   Creatinine, Ser 0.87 0.44 - 1.00 mg/dL   Calcium 9.2 8.9 - 10.3 mg/dL   Total Protein 7.2 6.5 - 8.1 g/dL   Albumin 4.0 3.5 - 5.0 g/dL   AST 100 (H) 15 - 41 U/L   ALT 76 (H) 0 - 44 U/L    Comment: Please note change in reference range.   Alkaline Phosphatase 81 38 - 126 U/L   Total Bilirubin 1.0 0.3 - 1.2 mg/dL   GFR calc non Af Amer >60 >60 mL/min   GFR calc Af Amer >60 >60 mL/min    Comment: (NOTE) The eGFR has been calculated using the CKD EPI equation. This calculation has not been validated in all clinical situations. eGFR's persistently <60 mL/min signify possible Chronic Kidney Disease.    Anion gap 9 5 - 15    Comment: Performed at [facility], [place] [address], [place], [place] [address]  CBC     Status: Abnormal   Collection Time: 01/12/18  5:30 AM  Result Value Ref  Range   WBC 13.0 (H) 4.0 - 10.5 K/uL   RBC 5.16 (H) 3.87 - 5.11 MIL/uL   Hemoglobin 15.9 (H) 12.0 - 15.0 g/dL   HCT 46.2 (H) 36.0 - 46.0 %   MCV 89.5 78.0 - 100.0 fL  MCH 30.8 26.0 - 34.0 pg   MCHC 34.4 30.0 - 36.0 g/dL   RDW 13.3 11.5 - 15.5 %   Platelets 177 150 - 400 K/uL    Comment: Performed at [facility], [place] [address], [place], [place] [address]  I-Stat beta hCG blood, ED     Status: None   Collection Time: 01/12/18  5:55 AM  Result Value Ref Range   I-stat hCG, quantitative <5.0 <5 mIU/mL   Comment 3            Comment:   GEST. AGE      CONC.  (mIU/mL)   <=1 WEEK        5 - 50     2 WEEKS       50 - 500     3 WEEKS       100 - 10,000     4 WEEKS     1,000 - 30,000        FEMALE AND NON-PREGNANT FEMALE:     LESS THAN 5 mIU/mL      RADIOLOGY RESULTS: Mr 3d Recon At Scanner  Result Date: 01/12/2018 CLINICAL DATA:  Right upper quadrant abdominal pain. Cholelithiasis at sonography. EXAM: MRI ABDOMEN WITHOUT AND WITH CONTRAST (INCLUDING MRCP) TECHNIQUE: Multiplanar multisequence MR imaging of the abdomen was performed both before and after the administration of intravenous contrast. Heavily T2-weighted images of the biliary and pancreatic ducts were obtained, and three-dimensional MRCP images were rendered by post processing. CONTRAST:  85m MULTIHANCE GADOBENATE DIMEGLUMINE 529 MG/ML IV SOLN COMPARISON:  01/12/2018 abdominal sonogram. FINDINGS: Significantly motion degraded scan particularly on the postcontrast images, limiting assessment. Lower chest: No acute abnormality at the lung bases. Hepatobiliary: Normal liver size and configuration. No significant hepatic steatosis. No liver mass. Multiple subcentimeter gallstones layering in the gallbladder. No significant gallbladder distention. No definite gallbladder wall thickening. No pericholecystic fluid. No biliary ductal dilatation. Common bile duct diameter 6 mm. No evidence of choledocholithiasis. No biliary  strictures or masses. Pancreas: No pancreatic mass or duct dilation.  No pancreas divisum. Spleen: Normal size. No mass. Adrenals/Urinary Tract: Normal adrenals. No hydronephrosis. Normal kidneys with no renal mass. Stomach/Bowel: Normal non-distended stomach. Visualized small and large bowel is normal caliber, with no bowel wall thickening. Vascular/Lymphatic: Normal caliber abdominal aorta. Patent portal, splenic, hepatic and renal veins. No pathologically enlarged lymph nodes in the abdomen. Other: No abdominal ascites or focal fluid collection. Musculoskeletal: No aggressive appearing focal osseous lesions. IMPRESSION: 1. Cholelithiasis.  No MRI findings of acute cholecystitis. 2. No intrahepatic biliary ductal dilatation. CBD diameter 6 mm, top-normal. No choledocholithiasis. Electronically Signed   By: JIlona SorrelM.D.   On: 01/12/2018 09:20   Mr Abdomen Mrcp WMoise BoringContast  Result Date: 01/12/2018 CLINICAL DATA:  Right upper quadrant abdominal pain. Cholelithiasis at sonography. EXAM: MRI ABDOMEN WITHOUT AND WITH CONTRAST (INCLUDING MRCP) TECHNIQUE: Multiplanar multisequence MR imaging of the abdomen was performed both before and after the administration of intravenous contrast. Heavily T2-weighted images of the biliary and pancreatic ducts were obtained, and three-dimensional MRCP images were rendered by post processing. CONTRAST:  128mMULTIHANCE GADOBENATE DIMEGLUMINE 529 MG/ML IV SOLN COMPARISON:  01/12/2018 abdominal sonogram. FINDINGS: Significantly motion degraded scan particularly on the postcontrast images, limiting assessment. Lower chest: No acute abnormality at the lung bases. Hepatobiliary: Normal liver size and configuration. No significant hepatic steatosis. No liver mass. Multiple subcentimeter gallstones layering in the gallbladder. No significant gallbladder distention. No definite gallbladder wall thickening.  No pericholecystic fluid. No biliary ductal dilatation. Common bile duct  diameter 6 mm. No evidence of choledocholithiasis. No biliary strictures or masses. Pancreas: No pancreatic mass or duct dilation.  No pancreas divisum. Spleen: Normal size. No mass. Adrenals/Urinary Tract: Normal adrenals. No hydronephrosis. Normal kidneys with no renal mass. Stomach/Bowel: Normal non-distended stomach. Visualized small and large bowel is normal caliber, with no bowel wall thickening. Vascular/Lymphatic: Normal caliber abdominal aorta. Patent portal, splenic, hepatic and renal veins. No pathologically enlarged lymph nodes in the abdomen. Other: No abdominal ascites or focal fluid collection. Musculoskeletal: No aggressive appearing focal osseous lesions. IMPRESSION: 1. Cholelithiasis.  No MRI findings of acute cholecystitis. 2. No intrahepatic biliary ductal dilatation. CBD diameter 6 mm, top-normal. No choledocholithiasis. Electronically Signed   By: Ilona Sorrel M.D.   On: 01/12/2018 09:20   US Abdomen Limited Ruq  Result Date: 01/12/2018 CLINICAL DATA:  Right upper quadrant pain. EXAM: ULTRASOUND ABDOMEN LIMITED RIGHT UPPER QUADRANT COMPARISON:  January 03, 2018 FINDINGS: Gallbladder: Cholelithiasis is identified. The largest stone is seen in the neck of the gallbladder measuring 1.1 cm. Sludge is identified as well. No Murphy's sign, pericholecystic fluid, or wall thickening. Common bile duct: Diameter: 5.1 mm Liver: No focal lesion identified. Within normal limits in parenchymal echogenicity. Portal vein is patent on color Doppler imaging with normal direction of blood flow towards the liver. IMPRESSION: 1. Cholelithiasis and sludge in the gallbladder as above with no wall thickening, pericholecystic fluid, or Murphy's sign. If concern persists, a HIDA scan could further evaluate. Electronically Signed   By: Dorise Bullion III M.D   On: 01/12/2018 07:29    Problem List: Patient Active Problem List   Diagnosis Date Noted  . Acute gallstone pancreatitis 01/12/2018  . Leukocytosis  01/12/2018  . Elevated LFTs 01/12/2018  . Fibromyalgia 01/03/2018    Assessment & Plan: Biliary pancreatitis;  Will recheck lipase in the am and consider lap chole on Sunday or Monday.      Matt B. Hassell Done, MD, [facility] [phone] beeper [phone]  01/12/2018 10:37 AM

## 2018-01-12 NOTE — Progress Notes (Signed)
Patient ID: Kelli AndersonJennifer Dines, female   DOB: 04/18/1981, 37 y.o.   MRN: [id number] MRCP negative for choledocholithiasis or cholecystitis.  Continue current management.  Will consult general surgery.

## 2018-01-12 NOTE — H&P (Signed)
History and Physical    Kelli Washington WUJ:[id number] DOB: Jul 16, 1980 DOA: 01/12/2018  PCP: Orland Mustard, MD   Patient coming from: Home  I have personally briefly reviewed patient's old medical records in [facility]  Chief Complaint: Abdominal pain  HPI: Kelli Washington is a 37 y.o. female with medical history significant of fibromyalgia, recently diagnosed gallstones presented with abdominal pain.  Patient states that the abdominal pain started last night, mostly in the right upper quadrant and epigastric pain which began several hours after eating along with nausea and vomiting.  Her pain progressively got worse, was intermittent and was up to 7 out of 10 in intensity.  No aggravating or relieving factors no radiation.  No fevers, chills, diarrhea, constipation, dysuria, increased frequency of urination, shortness of breath, cough, chest pain.  ED Course: She was found to have elevated lipase at 1187 and mildly elevated LFTs and mild leukocytosis.  Right upper quadrant ultrasound showed cholelithiasis but no signs of cholecystitis.  ED provider spoke to on-call GI provider who recommended MRCP and if positive for CBD stones, patient would need ERCP but if not, patient would need surgical evaluation.  Hospitalist service was called to evaluate the patient.  Review of Systems: As per HPI otherwise 10 point review of systems negative.   Past Medical History:  Diagnosis Date  . Anxiety   . Arthritis   . Asthma   . Depression   . Dysmenorrhea   . Fibromyalgia 2016  . Vaginal delivery 1999,2002,2010,2013    Past Surgical History:  Procedure Laterality Date  . CERVICAL BIOPSY  W/ LOOP ELECTRODE EXCISION    . COLPOSCOPY    . TUBAL LIGATION    . WISDOM TOOTH EXTRACTION  2006   Social history  reports that she has been smoking cigarettes.  She has been smoking about 1.00 pack per day. She has never used smokeless tobacco. She reports that she drinks about 0.6 - 1.2 oz of  alcohol per week. She reports that she does not use drugs.  No Known Allergies  Family History  Problem Relation Age of Onset  . Arthritis Mother   . Asthma Mother   . Cancer Mother        liver  . Depression Mother   . Drug abuse Mother   . Early death Mother   . Drug abuse Father   . Heart attack Father   . Heart disease Father   . Kidney disease Father   . Stroke Father   . Stroke Maternal Grandmother     Prior to Admission medications   Medication Sig Start Date End Date Taking? Authorizing Provider  venlafaxine XR (EFFEXOR-XR) 75 MG 24 hr capsule Take 1 capsule (75 mg total) by mouth daily with breakfast. 01/03/18  Yes Orland Mustard, MD  HYDROcodone-acetaminophen (NORCO/VICODIN) 5-325 MG tablet Take 1-2 tablets by mouth every 4 (four) hours as needed. Patient not taking: Reported on 01/12/2018 01/03/18   Roxy Horseman, PA-C  ondansetron (ZOFRAN ODT) 4 MG disintegrating tablet Take 1 tablet (4 mg total) by mouth every 8 (eight) hours as needed for nausea or vomiting. Patient not taking: Reported on 01/12/2018 01/03/18   Roxy Horseman, PA-C    Physical Exam: Vitals:   01/12/18 0314 01/12/18 0315 01/12/18 0520 01/12/18 0747  BP: 120/83  108/63 99/60  Pulse: 65  (!) 54 (!) 51  Resp: 18  16 18   Temp: 97.6 F (36.4 C)     TempSrc: Oral     SpO2: 100%  98% 99%  Weight:  73 kg (161 lb)    Height:  5\' 5"  (1.651 m)      Constitutional: Young female lying in bed in no distress currently Vitals:   01/12/18 0314 01/12/18 0315 01/12/18 0520 01/12/18 0747  BP: 120/83  108/63 99/60  Pulse: 65  (!) 54 (!) 51  Resp: 18  16 18   Temp: 97.6 F (36.4 C)     TempSrc: Oral     SpO2: 100%  98% 99%  Weight:  73 kg (161 lb)    Height:  5\' 5"  (1.651 m)     Eyes: PERRL, lids and conjunctivae normal ENMT: Mucous membranes are dry. Posterior pharynx clear of any exudate or lesions. Neck: normal, supple, no masses, no thyromegaly Respiratory: bilateral decreased breath sounds at  bases, no wheezing, no crackles. Normal respiratory effort. No accessory muscle use.  Cardiovascular: S1 S2 positive, rate controlled. No extremity edema. 2+ pedal pulses.  Abdomen: Mild tenderness in the right upper quadrant and epigastric area.  No rebound tenderness, no masses palpated. No hepatosplenomegaly. Bowel sounds positive.  Musculoskeletal: no clubbing / cyanosis. No joint deformity upper and lower extremities.  Skin: no rashes, lesions, ulcers. No induration Neurologic: CN 2-12 grossly intact. Moving extremities. No focal neurologic deficits.  Psychiatric: Normal judgment and insight. Alert and oriented x 3.  Normal mood.    Labs on Admission: I have personally reviewed following labs and imaging studies  CBC: Recent Labs  Lab 01/12/18 0530  WBC 13.0*  HGB 15.9*  HCT 46.2*  MCV 89.5  PLT 177   Basic Metabolic Panel: Recent Labs  Lab 01/12/18 0530  NA 138  K 3.8  CL 102  CO2 27  GLUCOSE 109*  BUN 15  CREATININE 0.87  CALCIUM 9.2   GFR: Estimated Creatinine Clearance: 89.5 mL/min (by C-G formula based on SCr of 0.87 mg/dL). Liver Function Tests: Recent Labs  Lab 01/12/18 0530  AST 100*  ALT 76*  ALKPHOS 81  BILITOT 1.0  PROT 7.2  ALBUMIN 4.0   Recent Labs  Lab 01/12/18 0530  LIPASE 1,187*   No results for input(s): AMMONIA in the last 168 hours. Coagulation Profile: No results for input(s): INR, PROTIME in the last 168 hours. Cardiac Enzymes: No results for input(s): CKTOTAL, CKMB, CKMBINDEX, TROPONINI in the last 168 hours. BNP (last 3 results) No results for input(s): PROBNP in the last 8760 hours. HbA1C: No results for input(s): HGBA1C in the last 72 hours. CBG: No results for input(s): GLUCAP in the last 168 hours. Lipid Profile: No results for input(s): CHOL, HDL, LDLCALC, TRIG, CHOLHDL, LDLDIRECT in the last 72 hours. Thyroid Function Tests: No results for input(s): TSH, T4TOTAL, FREET4, T3FREE, THYROIDAB in the last 72  hours. Anemia Panel: No results for input(s): VITAMINB12, FOLATE, FERRITIN, TIBC, IRON, RETICCTPCT in the last 72 hours. Urine analysis:    Component Value Date/Time   COLORURINE YELLOW 01/12/2018 0314   APPEARANCEUR CLEAR 01/12/2018 0314   LABSPEC 1.025 01/12/2018 0314   PHURINE 5.0 01/12/2018 0314   GLUCOSEU NEGATIVE 01/12/2018 0314   HGBUR NEGATIVE 01/12/2018 0314   BILIRUBINUR NEGATIVE 01/12/2018 0314   KETONESUR 5 (A) 01/12/2018 0314   PROTEINUR NEGATIVE 01/12/2018 0314   NITRITE NEGATIVE 01/12/2018 0314   LEUKOCYTESUR NEGATIVE 01/12/2018 0314    Radiological Exams on Admission: US Abdomen Limited Ruq  Result Date: 01/12/2018 CLINICAL DATA:  Right upper quadrant pain. EXAM: ULTRASOUND ABDOMEN LIMITED RIGHT UPPER QUADRANT COMPARISON:  January 03, 2018 FINDINGS: Gallbladder:  Cholelithiasis is identified. The largest stone is seen in the neck of the gallbladder measuring 1.1 cm. Sludge is identified as well. No Murphy's sign, pericholecystic fluid, or wall thickening. Common bile duct: Diameter: 5.1 mm Liver: No focal lesion identified. Within normal limits in parenchymal echogenicity. Portal vein is patent on color Doppler imaging with normal direction of blood flow towards the liver. IMPRESSION: 1. Cholelithiasis and sludge in the gallbladder as above with no wall thickening, pericholecystic fluid, or Murphy's sign. If concern persists, a HIDA scan could further evaluate. Electronically Signed   By: Gerome Samavid  Williams III M.D   On: 01/12/2018 07:29    Assessment/Plan Principal Problem:   Acute gallstone pancreatitis Active Problems:   Leukocytosis   Elevated LFTs  Acute gallstone pancreatitis with mildly elevated LFTs -Patient received IV fluid bolus in the ED.  Continue normal saline at 200 cc an hour.  Pain management and antiemetics.  ED provider spoke to on-call GI provider who recommended MRCP.  Follow-up MRCP results, if positive for CBD stones, patient will need ERCP.  If  negative for CBD stones, patient will need surgical evaluation. -Patient was given a dose of Zosyn in the ED, will hold off on further antibiotics for now. -N.p.o. for now  Leukocytosis -Probably reactive from above  History of fibromyalgia -Resume Effexor once patient starts taking orally.  Outpatient follow-up   DVT prophylaxis: Early ambulation.  Hold Lovenox as patient might need surgery Code Status: Full Family Communication: None at bedside Disposition Plan: Depends on clinical outcome Consults called: None yet Admission status: Inpatient in MedSurg  Severity of Illness: The appropriate patient status for this patient is INPATIENT. Inpatient status is judged to be reasonable and necessary in order to provide the required intensity of service to ensure the patient's safety. The patient's presenting symptoms, physical exam findings, and initial radiographic and laboratory data in the context of their chronic comorbidities is felt to place them at high risk for further clinical deterioration. Furthermore, it is not anticipated that the patient will be medically stable for discharge from the hospital within 2 midnights of admission. The following factors support the patient status of inpatient.   " The patient's presenting symptoms include abdominal pain. " The worrisome physical exam findings include abdominal tenderness. " The initial radiographic and laboratory data are worrisome because of cholelithiasis/elevated LFTs/elevated lipase/leukocytosis. " The chronic co-morbidities include cholelithiasis.   * I certify that at the point of admission it is my clinical judgment that the patient will require inpatient hospital care spanning beyond 2 midnights from the point of admission due to high intensity of service, high risk for further deterioration and high frequency of surveillance required.Glade Lloyd*     Andreea Arca MD [facility] Pager [phone]- [phone]  If 7PM-7AM, please  contact night-coverage [email or web address] Password [email or web address]  01/12/2018, 8:19 AM

## 2018-01-12 NOTE — ED Notes (Signed)
Patient transported to MRI 

## 2018-01-12 NOTE — ED Provider Notes (Signed)
[facility]-EMERGENCY DEPT Provider Note   CSN: [id number] Arrival date & time: 01/12/18  0158     History   Chief Complaint Chief Complaint  Patient presents with  . Abdominal Pain    HPI Kelli Washington is a 37 y.o. female.  HPI 37 year old female with history of recently diagnosed gallstones here with right upper quadrant epigastric pain.  The patient states that starting last night, she developed gradual onset of progressively worsening epigastric and right upper quadrant pain.  This began several hours after eating.  She said associated nausea and has not been able to eat or drink.  The pain is constant and worsening.  It feels similar to her gallbladder pain, only worse.  She was previously seen for this, and states her pain had improved and she is being set up for an outpatient evaluation with surgery.  Denies any fevers or chills.  No diarrhea.  No constipation.  Past Medical History:  Diagnosis Date  . Anxiety   . Arthritis   . Asthma   . Depression   . Dysmenorrhea   . Fibromyalgia 2016  . Vaginal delivery 1999,2002,2010,2013    Patient Active Problem List   Diagnosis Date Noted  . Fibromyalgia 01/03/2018    Past Surgical History:  Procedure Laterality Date  . CERVICAL BIOPSY  W/ LOOP ELECTRODE EXCISION    . COLPOSCOPY    . TUBAL LIGATION    . WISDOM TOOTH EXTRACTION  2006     OB History    Gravida  4   Para  4   Term  4   Preterm      AB      Living  4     SAB      TAB      Ectopic      Multiple      Live Births  4            Home Medications    Prior to Admission medications   Medication Sig Start Date End Date Taking? Authorizing Provider  HYDROcodone-acetaminophen (NORCO/VICODIN) 5-325 MG tablet Take 1-2 tablets by mouth every 4 (four) hours as needed. 01/03/18   Roxy HorsemanBrowning, Robert, PA-C  ondansetron (ZOFRAN ODT) 4 MG disintegrating tablet Take 1 tablet (4 mg total) by mouth every 8 (eight) hours as needed  for nausea or vomiting. 01/03/18   Roxy HorsemanBrowning, Robert, PA-C  venlafaxine XR (EFFEXOR-XR) 75 MG 24 hr capsule Take 1 capsule (75 mg total) by mouth daily with breakfast. 01/03/18   Orland MustardWolfe, Allison, MD    Family History Family History  Problem Relation Age of Onset  . Arthritis Mother   . Asthma Mother   . Cancer Mother        liver  . Depression Mother   . Drug abuse Mother   . Early death Mother   . Drug abuse Father   . Heart attack Father   . Heart disease Father   . Kidney disease Father   . Stroke Father   . Stroke Maternal Grandmother     Social History Social History   Tobacco Use  . Smoking status: Current Every Day Smoker    Packs/day: 1.00    Types: Cigarettes  . Smokeless tobacco: Never Used  Substance Use Topics  . Alcohol use: Yes    Alcohol/week: 0.6 - 1.2 oz    Types: 1 - 2 Standard drinks or equivalent per week  . Drug use: Never     Allergies  Patient has no known allergies.   Review of Systems Review of Systems  Constitutional: Positive for fatigue. Negative for chills and fever.  HENT: Negative for congestion, rhinorrhea and sore throat.   Eyes: Negative for visual disturbance.  Respiratory: Negative for cough, shortness of breath and wheezing.   Cardiovascular: Negative for chest pain and leg swelling.  Gastrointestinal: Positive for abdominal pain, nausea and vomiting. Negative for diarrhea.  Genitourinary: Negative for dysuria, flank pain, vaginal bleeding and vaginal discharge.  Musculoskeletal: Negative for neck pain.  Skin: Negative for rash.  Allergic/Immunologic: Negative for immunocompromised state.  Neurological: Negative for syncope and headaches.  Hematological: Does not bruise/bleed easily.  All other systems reviewed and are negative.    Physical Exam Updated Vital Signs BP 99/60   Pulse (!) 51   Temp 97.6 F (36.4 C) (Oral)   Resp 18   Ht 5\' 5"  (1.651 m)   Wt 73 kg (161 lb)   LMP 12/28/2017 (Exact Date) Comment: tubal   SpO2 99%   BMI 26.79 kg/m   Physical Exam  Constitutional: She is oriented to person, place, and time. She appears well-developed and well-nourished. No distress.  HENT:  Head: Normocephalic and atraumatic.  Eyes: Conjunctivae are normal.  Neck: Neck supple.  Cardiovascular: Normal rate, regular rhythm and normal heart sounds.  Pulmonary/Chest: Effort normal. No respiratory distress. She has no wheezes.  Abdominal: She exhibits no distension. There is generalized tenderness and tenderness in the right upper quadrant and epigastric area. There is positive Murphy's sign. There is no rigidity, no rebound and no guarding.  Musculoskeletal: She exhibits no edema.  Neurological: She is alert and oriented to person, place, and time. She exhibits normal muscle tone.  Skin: Skin is warm. Capillary refill takes less than 2 seconds. No rash noted.  Nursing note and vitals reviewed.    ED Treatments / Results  Labs (all labs ordered are listed, but only abnormal results are displayed) Labs Reviewed  LIPASE, BLOOD - Abnormal; Notable for the following components:      Result Value   Lipase 1,187 (*)    All other components within normal limits  COMPREHENSIVE METABOLIC PANEL - Abnormal; Notable for the following components:   Glucose, Bld 109 (*)    AST 100 (*)    ALT 76 (*)    All other components within normal limits  CBC - Abnormal; Notable for the following components:   WBC 13.0 (*)    RBC 5.16 (*)    Hemoglobin 15.9 (*)    HCT 46.2 (*)    All other components within normal limits  URINALYSIS, ROUTINE W REFLEX MICROSCOPIC - Abnormal; Notable for the following components:   Ketones, ur 5 (*)    All other components within normal limits  I-STAT BETA HCG BLOOD, ED ([facility], [facility], [facility] ONLY)    EKG None  Radiology US Abdomen Limited Ruq  Result Date: 01/12/2018 CLINICAL DATA:  Right upper quadrant pain. EXAM: ULTRASOUND ABDOMEN LIMITED RIGHT UPPER QUADRANT COMPARISON:  January 03, 2018  FINDINGS: Gallbladder: Cholelithiasis is identified. The largest stone is seen in the neck of the gallbladder measuring 1.1 cm. Sludge is identified as well. No Murphy's sign, pericholecystic fluid, or wall thickening. Common bile duct: Diameter: 5.1 mm Liver: No focal lesion identified. Within normal limits in parenchymal echogenicity. Portal vein is patent on color Doppler imaging with normal direction of blood flow towards the liver. IMPRESSION: 1. Cholelithiasis and sludge in the gallbladder as above with no wall thickening,  pericholecystic fluid, or Murphy's sign. If concern persists, a HIDA scan could further evaluate. Electronically Signed   By: Gerome Sam III M.D   On: 01/12/2018 07:29    Procedures Procedures (including critical care time)  Medications Ordered in ED Medications  piperacillin-tazobactam (ZOSYN) IVPB 3.375 g (has no administration in time range)  sodium chloride 0.9 % bolus 1,000 mL (has no administration in time range)  0.9 %  sodium chloride infusion (has no administration in time range)  sodium chloride 0.9 % bolus 1,000 mL (0 mLs Intravenous Stopped 01/12/18 0711)  HYDROmorphone (DILAUDID) injection 1 mg (1 mg Intravenous Given 01/12/18 0552)  ondansetron (ZOFRAN) injection 4 mg (4 mg Intravenous Given 01/12/18 0552)     Initial Impression / Assessment and Plan / ED Course  I have reviewed the triage vital signs and the nursing notes.  Pertinent labs & imaging results that were available during my care of the patient were reviewed by me and considered in my medical decision making (see chart for details).     37 year old female here with right upper quadrant abdominal pain.  Clinically, concern for acute cholecystitis.  Patient does have significant elevation in her AST and ALT.  Will check lipase, right upper quadrant ultrasound, and likely needs surgical consultation.  Lab work shows elevated lipase as well, concerning for choledocholithiasis.  Right upper  quadrant ultrasound shows gallstones, sludge, but common bile duct is only 5.1 mm.  Discussed with Dr. Christella Hartigan of on-call lumbar GI.  He recommends medicine admission, MRCP.  If MRCP shows no retained stone, he recommends surgical evaluation.  If retained stone, will need ERCP.   Final Clinical Impressions(s) / ED Diagnoses   Final diagnoses:  RUQ pain  Gallstone pancreatitis    ED Discharge Orders    None       Shaune Pollack, MD 01/12/18 [phone]

## 2018-01-13 ENCOUNTER — Inpatient Hospital Stay (HOSPITAL_COMMUNITY): Payer: Managed Care, Other (non HMO) | Admitting: Anesthesiology

## 2018-01-13 ENCOUNTER — Encounter (HOSPITAL_COMMUNITY): Payer: Self-pay | Admitting: Registered Nurse

## 2018-01-13 ENCOUNTER — Encounter (HOSPITAL_COMMUNITY)
Admission: EM | Disposition: A | Discharge status: Home / Self Care | Payer: Self-pay | Source: Home / Self Care | Attending: Internal Medicine

## 2018-01-13 ENCOUNTER — Inpatient Hospital Stay (HOSPITAL_COMMUNITY): Payer: Managed Care, Other (non HMO)

## 2018-01-13 DIAGNOSIS — K851 Biliary acute pancreatitis without necrosis or infection: Principal | ICD-10-CM

## 2018-01-13 HISTORY — PX: CHOLECYSTECTOMY: SHX55

## 2018-01-13 LAB — COMPREHENSIVE METABOLIC PANEL
ALBUMIN: 2.8 g/dL — AB (ref 3.5–5.0)
ALK PHOS: 59 U/L (ref 38–126)
ALT: 43 U/L (ref 0–44)
AST: 30 U/L (ref 15–41)
Anion gap: 8 (ref 5–15)
BILIRUBIN TOTAL: 0.9 mg/dL (ref 0.3–1.2)
BUN: 6 mg/dL (ref 6–20)
CALCIUM: 8.1 mg/dL — AB (ref 8.9–10.3)
CO2: 21 mmol/L — ABNORMAL LOW (ref 22–32)
Chloride: 109 mmol/L (ref 98–111)
Creatinine, Ser: 0.78 mg/dL (ref 0.44–1.00)
GFR calc Af Amer: >60 mL/min (ref >60)
Glucose, Bld: 79 mg/dL (ref 70–99)
POTASSIUM: 3.7 mmol/L (ref 3.5–5.1)
Sodium: 138 mmol/L (ref 135–145)
TOTAL PROTEIN: 5.2 g/dL — AB (ref 6.5–8.1)

## 2018-01-13 LAB — CBC
HEMATOCRIT: 40.9 % (ref 36.0–46.0)
HEMOGLOBIN: 13.5 g/dL (ref 12.0–15.0)
MCH: 30 pg (ref 26.0–34.0)
MCHC: 33 g/dL (ref 30.0–36.0)
MCV: 90.9 fL (ref 78.0–100.0)
Platelets: 148 10*3/uL — ABNORMAL LOW (ref 150–400)
RBC: 4.5 MIL/uL (ref 3.87–5.11)
RDW: 13.4 % (ref 11.5–15.5)
WBC: 7.7 10*3/uL (ref 4.0–10.5)

## 2018-01-13 LAB — LIPASE, BLOOD: LIPASE: 41 U/L (ref 11–51)

## 2018-01-13 SURGERY — LAPAROSCOPIC CHOLECYSTECTOMY WITH INTRAOPERATIVE CHOLANGIOGRAM
Anesthesia: General | Site: Abdomen

## 2018-01-13 MED ORDER — ACETAMINOPHEN 500 MG PO TABS: 1000.0000 mg | ORAL_TABLET | ORAL | Status: DC

## 2018-01-13 MED ORDER — FENTANYL CITRATE (PF) 100 MCG/2ML IJ SOLN
25.0000 ug | INTRAMUSCULAR | Status: DC | PRN
  Administered 2018-01-13: 25 ug via INTRAVENOUS

## 2018-01-13 MED ORDER — SODIUM CHLORIDE 0.9 % IV SOLN
2.0000 g | INTRAVENOUS | Status: AC
  Administered 2018-01-13: 2 g via INTRAVENOUS

## 2018-01-13 MED ORDER — PROPOFOL 10 MG/ML IV BOLUS
INTRAVENOUS | Status: DC | PRN
  Administered 2018-01-13: 50 mg via INTRAVENOUS
  Administered 2018-01-13: 150 mg via INTRAVENOUS

## 2018-01-13 MED ORDER — MIDAZOLAM HCL 2 MG/2ML IJ SOLN
INTRAMUSCULAR | Status: AC
  Filled 2018-01-13: qty 2

## 2018-01-13 MED ORDER — SUGAMMADEX SODIUM 200 MG/2ML IV SOLN
INTRAVENOUS | Status: DC | PRN
  Administered 2018-01-13: 200 mg via INTRAVENOUS

## 2018-01-13 MED ORDER — LIDOCAINE 2% (20 MG/ML) 5 ML SYRINGE
INTRAMUSCULAR | Status: DC | PRN
  Administered 2018-01-13: 60 mg via INTRAVENOUS

## 2018-01-13 MED ORDER — DEXAMETHASONE SODIUM PHOSPHATE 10 MG/ML IJ SOLN
INTRAMUSCULAR | Status: DC | PRN
  Administered 2018-01-13: 10 mg via INTRAVENOUS

## 2018-01-13 MED ORDER — LIDOCAINE 2% (20 MG/ML) 5 ML SYRINGE
INTRAMUSCULAR | Status: AC
  Filled 2018-01-13: qty 5

## 2018-01-13 MED ORDER — CEFAZOLIN SODIUM-DEXTROSE 2-4 GM/100ML-% IV SOLN: 2.0000 g | INTRAVENOUS | Status: AC

## 2018-01-13 MED ORDER — ROCURONIUM BROMIDE 10 MG/ML (PF) SYRINGE
PREFILLED_SYRINGE | INTRAVENOUS | Status: DC | PRN
  Administered 2018-01-13: 40 mg via INTRAVENOUS
  Administered 2018-01-13: 10 mg via INTRAVENOUS

## 2018-01-13 MED ORDER — FENTANYL CITRATE (PF) 100 MCG/2ML IJ SOLN
INTRAMUSCULAR | Status: AC
  Filled 2018-01-13: qty 2

## 2018-01-13 MED ORDER — IBUPROFEN 200 MG PO TABS: 600.0000 mg | ORAL_TABLET | Freq: Four times a day (QID) | ORAL | Status: DC | PRN

## 2018-01-13 MED ORDER — BUPIVACAINE-EPINEPHRINE 0.25% -1:200000 IJ SOLN
INTRAMUSCULAR | Status: DC | PRN
  Administered 2018-01-13: 15 mL

## 2018-01-13 MED ORDER — IOPAMIDOL (ISOVUE-300) INJECTION 61%
INTRAVENOUS | Status: DC | PRN
  Administered 2018-01-13: 50 mL

## 2018-01-13 MED ORDER — TRAMADOL HCL 50 MG PO TABS
50.0000 mg | ORAL_TABLET | Freq: Four times a day (QID) | ORAL | Status: DC | PRN
  Administered 2018-01-13 – 2018-01-14 (×2): 50 mg via ORAL
  Filled 2018-01-13 (×2): qty 1

## 2018-01-13 MED ORDER — GABAPENTIN 300 MG PO CAPS: 300.0000 mg | ORAL_CAPSULE | ORAL | Status: DC

## 2018-01-13 MED ORDER — PROPOFOL 10 MG/ML IV BOLUS
INTRAVENOUS | Status: AC
  Filled 2018-01-13: qty 20

## 2018-01-13 MED ORDER — ONDANSETRON HCL 4 MG/2ML IJ SOLN
INTRAMUSCULAR | Status: AC
  Filled 2018-01-13: qty 2

## 2018-01-13 MED ORDER — SUGAMMADEX SODIUM 200 MG/2ML IV SOLN
INTRAVENOUS | Status: AC
  Filled 2018-01-13: qty 2

## 2018-01-13 MED ORDER — DEXAMETHASONE SODIUM PHOSPHATE 10 MG/ML IJ SOLN
INTRAMUSCULAR | Status: AC
  Filled 2018-01-13: qty 1

## 2018-01-13 MED ORDER — ONDANSETRON HCL 4 MG/2ML IJ SOLN
INTRAMUSCULAR | Status: DC | PRN
  Administered 2018-01-13: 4 mg via INTRAVENOUS

## 2018-01-13 MED ORDER — LACTATED RINGERS IV SOLN
INTRAVENOUS | Status: DC
  Administered 2018-01-13 (×2): via INTRAVENOUS

## 2018-01-13 MED ORDER — 0.9 % SODIUM CHLORIDE (POUR BTL) OPTIME
TOPICAL | Status: DC | PRN
  Administered 2018-01-13: 1000 mL

## 2018-01-13 MED ORDER — CELECOXIB 200 MG PO CAPS
200.0000 mg | ORAL_CAPSULE | ORAL | Status: DC
  Filled 2018-01-13: qty 1

## 2018-01-13 MED ORDER — CHLORHEXIDINE GLUCONATE CLOTH 2 % EX PADS
6.0000 | MEDICATED_PAD | Freq: Once | CUTANEOUS | Status: AC
  Administered 2018-01-13: 6 via TOPICAL

## 2018-01-13 MED ORDER — MIDAZOLAM HCL 5 MG/5ML IJ SOLN
INTRAMUSCULAR | Status: DC | PRN
  Administered 2018-01-13: 2 mg via INTRAVENOUS

## 2018-01-13 MED ORDER — FENTANYL CITRATE (PF) 250 MCG/5ML IJ SOLN
INTRAMUSCULAR | Status: AC
  Filled 2018-01-13: qty 5

## 2018-01-13 MED ORDER — ROCURONIUM BROMIDE 100 MG/10ML IV SOLN
INTRAVENOUS | Status: AC
  Filled 2018-01-13: qty 1

## 2018-01-13 MED ORDER — LACTATED RINGERS IR SOLN
Status: DC | PRN
  Administered 2018-01-13: 1000 mL

## 2018-01-13 MED ORDER — PROMETHAZINE HCL 25 MG/ML IJ SOLN: 6.2500 mg | INTRAMUSCULAR | Status: DC | PRN

## 2018-01-13 MED ORDER — FENTANYL CITRATE (PF) 250 MCG/5ML IJ SOLN
INTRAMUSCULAR | Status: DC | PRN
  Administered 2018-01-13: 100 ug via INTRAVENOUS
  Administered 2018-01-13 (×3): 50 ug via INTRAVENOUS

## 2018-01-13 MED ORDER — SODIUM CHLORIDE 0.9 % IV SOLN
INTRAVENOUS | Status: AC
  Filled 2018-01-13: qty 2

## 2018-01-13 SURGICAL SUPPLY — 31 items
APPLIER CLIP ROT 10 11.4 M/L (STAPLE) ×3
CABLE HIGH FREQUENCY MONO STRZ (ELECTRODE) ×3 IMPLANT
CHLORAPREP W/TINT 26ML (MISCELLANEOUS) ×3 IMPLANT
CLIP APPLIE ROT 10 11.4 M/L (STAPLE) ×1 IMPLANT
COVER MAYO STAND STRL (DRAPES) ×3 IMPLANT
COVER SURGICAL LIGHT HANDLE (MISCELLANEOUS) ×3 IMPLANT
DECANTER SPIKE VIAL GLASS SM (MISCELLANEOUS) ×3 IMPLANT
DERMABOND ADVANCED (GAUZE/BANDAGES/DRESSINGS) ×2
DERMABOND ADVANCED .7 DNX12 (GAUZE/BANDAGES/DRESSINGS) ×1 IMPLANT
DRAPE C-ARM 42X120 X-RAY (DRAPES) ×3 IMPLANT
ELECT REM PT RETURN 15FT ADLT (MISCELLANEOUS) ×3 IMPLANT
GLOVE BIO SURGEON STRL SZ 6 (GLOVE) ×3 IMPLANT
GLOVE INDICATOR 6.5 STRL GRN (GLOVE) ×3 IMPLANT
GOWN STRL REUS W/TWL LRG LVL3 (GOWN DISPOSABLE) ×3 IMPLANT
GOWN STRL REUS W/TWL XL LVL3 (GOWN DISPOSABLE) ×6 IMPLANT
GRASPER SUT TROCAR 14GX15 (MISCELLANEOUS) IMPLANT
HEMOSTAT SNOW SURGICEL 2X4 (HEMOSTASIS) IMPLANT
KIT BASIN OR (CUSTOM PROCEDURE TRAY) ×3 IMPLANT
NEEDLE INSUFFLATION 14GA 120MM (NEEDLE) ×3 IMPLANT
POUCH SPECIMEN RETRIEVAL 10MM (ENDOMECHANICALS) ×3 IMPLANT
SCISSORS LAP 5X35 DISP (ENDOMECHANICALS) ×3 IMPLANT
SET CHOLANGIOGRAPH MIX (MISCELLANEOUS) ×3 IMPLANT
SET IRRIG TUBING LAPAROSCOPIC (IRRIGATION / IRRIGATOR) ×3 IMPLANT
SLEEVE XCEL OPT CAN 5 100 (ENDOMECHANICALS) ×6 IMPLANT
SUT MNCRL AB 4-0 PS2 18 (SUTURE) ×3 IMPLANT
TOWEL OR 17X26 10 PK STRL BLUE (TOWEL DISPOSABLE) ×3 IMPLANT
TOWEL OR NON WOVEN STRL DISP B (DISPOSABLE) ×3 IMPLANT
TRAY LAPAROSCOPIC (CUSTOM PROCEDURE TRAY) ×3 IMPLANT
TROCAR BLADELESS OPT 5 100 (ENDOMECHANICALS) ×3 IMPLANT
TROCAR XCEL 12X100 BLDLESS (ENDOMECHANICALS) ×3 IMPLANT
TUBING INSUF HEATED (TUBING) ×3 IMPLANT

## 2018-01-13 NOTE — Progress Notes (Signed)
1110 Recd pt from surgery. Condition stable vss. Site intack. Cont with plan of care   1330 Pt tolerated clear liquids diet. And ambulated in the room and hall way. Without difficulty Gait steady and required standby assist  1400- 1700 pt tolerated liquids denies gi compromise. No abd distention noted. Will advance diet as per MD order.   1830- Pt tolerated diet without diffculty. Wanted to advance to full liquid but pt insistent that she get a regular diet. Denies GI compromise.   Cont with plan of care

## 2018-01-13 NOTE — Anesthesia Procedure Notes (Signed)
Procedure Name: Intubation Date/Time: 01/13/2018 8:52 AM Performed by: Talbot Grumbling, CRNA Pre-anesthesia Checklist: Patient identified, Emergency Drugs available, Suction available and Patient being monitored Patient Re-evaluated:Patient Re-evaluated prior to induction Preoxygenation: Pre-oxygenation with 100% oxygen Induction Type: IV induction Ventilation: Mask ventilation without difficulty Laryngoscope Size: Mac and 3 Grade View: Grade I Tube type: Oral Tube size: 7.0 mm Number of attempts: 1 Airway Equipment and Method: Stylet Placement Confirmation: ETT inserted through vocal cords under direct vision,  positive ETCO2 and breath sounds checked- equal and bilateral Secured at: 22 cm Tube secured with: Tape Dental Injury: Teeth and Oropharynx as per pre-operative assessment

## 2018-01-13 NOTE — Op Note (Signed)
Operative Note  Kelli AndersonJennifer Kaine 37 y.o. female [id number]  01/13/2018  Surgeon: Berna Buehelsea A Vegas Fritze MD  Assistant: Wenda LowMatt Martin MD  Procedure performed: Laparoscopic Cholecystectomy with intraoperative cholangiogram   Preop diagnosis: gallstone pancreatitis Post-op diagnosis/intraop findings: same  Specimens: gallbladder  EBL: minimal  Complications: none  Description of procedure: After obtaining informed consent the patient was brought to the operating room. Prophylactic antibiotics were administered. SCD's were applied. General endotracheal anesthesia was initiated and a formal time-out was performed. The abdomen was prepped and draped in the usual sterile fashion and the abdomen was entered using an infraumbilical veress needle after instilling the site with local. Insufflation to 15mmHg was obtained and gross inspection revealed no evidence of injury from our entry or other intraabdominal abnormalities. Two 5mm trocars were introduced in the right midclavicular and right anterior axillary lines under direct visualization and following infiltration with local. An 11mm trocar was placed in the epigastrium. The gallbladder was retracted cephalad and the infundibulum was retracted laterally. A combination of hook electrocautery and blunt dissection was utilized to clear the peritoneum from the neck and cystic duct, circumferentially isolating the cystic artery and cystic duct and lifting the gallbladder from the cystic plate. The critical view of safety was achieved with the cystic artery, cystic duct, and liver bed visualized between them with no other structures. The artery was clipped with a single clip proximally and distally and divided. A clip was placed at the junction of the cystic duct and all bladder neck and a ductotomy made sharply. The cholangiogram catheter was inserted and secured with a clip. Cholangiogram was performed demonstrating appropriate anatomy and ready emptying into the  duodenum without any filling defects that I could appreciate. Final report is pending. The cholangiogram catheter was removed along with its securing clip and then 3 clips were placed on the proximal cystic duct prior to completing its division. The gallbladder was dissected from the liver plate using electrocautery. Once freed the gallbladder was placed in an endocatch bag and removed intact through the epigastric trocar site. A small amount of bleeding on the liver bed was controlled with cautery. The right upper quadrant was irrigated and aspirated, the effluent was clear. Hemostasis was once again confirmed, and reinspection of the abdomen revealed no injuries. The clips were well opposed without any bile leak from the duct or the liver bed. The 11mm trocar site in the epigastrium was closed with a 0 vicryl in the fascia under direct visualization using a PMI device. The abdomen was desufflated and all trocars removed. The skin incisions were closed with running subcuticular monocryl and Dermabond. The patient was awakened, extubated and transported to the recovery room in stable condition.   All counts were correct at the completion of the case.

## 2018-01-13 NOTE — Anesthesia Postprocedure Evaluation (Signed)
Anesthesia Post Note  Patient: Kelli AndersonJennifer Washington  Procedure(s) Performed: LAPAROSCOPIC CHOLECYSTECTOMY WITH INTRAOPERATIVE CHOLANGIOGRAM (N/A Abdomen)     Patient location during evaluation: PACU Anesthesia Type: General Level of consciousness: awake and alert Pain management: pain level controlled Vital Signs Assessment: post-procedure vital signs reviewed and stable Respiratory status: spontaneous breathing, nonlabored ventilation, respiratory function stable and patient connected to nasal cannula oxygen Cardiovascular status: blood pressure returned to baseline and stable Postop Assessment: no apparent nausea or vomiting Anesthetic complications: no    Last Vitals:  Vitals:   01/13/18 1045 01/13/18 1108  BP: 128/79 112/77  Pulse:  (!) 59  Resp:  14  Temp: 36.7 C (!) 36.3 C  SpO2:  99%    Last Pain:  Vitals:   01/13/18 1108  TempSrc: Oral  PainSc:                  Kennieth RadFitzgerald, Meeghan Skipper E

## 2018-01-13 NOTE — Progress Notes (Signed)
Subjective/Chief Complaint: Pain is better.   Objective: Vital signs in last 24 hours: Temp:  [97.3 F (36.3 C)-98.3 F (36.8 C)] 97.3 F (36.3 C) (07/07 0615) Pulse Rate:  [51-61] 52 (07/07 0615) Resp:  [16-18] 16 (07/07 0615) BP: (98-121)/(60-74) 121/74 (07/07 0615) SpO2:  [98 %-100 %] 98 % (07/07 0615) Weight:  [72.6 kg (160 lb)] 72.6 kg (160 lb) (07/06 0954) Last BM Date: 01/11/18  Intake/Output from previous day: 07/06 0701 - 07/07 0700 In: 5050 [I.V.:4000; IV Piggyback:1050] Out: -  Intake/Output this shift: No intake/output data recorded.  General appearance: alert and cooperative Resp: clear to auscultation bilaterally GI: soft, non-tender; bowel sounds normal; no masses,  no organomegaly Skin: Skin color, texture, turgor normal. No rashes or lesions Neurologic: Grossly normal  Lab Results:  Recent Labs    01/12/18 0530 01/13/18 0417  WBC 13.0* 7.7  HGB 15.9* 13.5  HCT 46.2* 40.9  PLT 177 148*   BMET Recent Labs    01/12/18 0530 01/13/18 0417  NA 138 138  K 3.8 3.7  CL 102 109  CO2 27 21*  GLUCOSE 109* 79  BUN 15 6  CREATININE 0.87 0.78  CALCIUM 9.2 8.1*   PT/INR No results for input(s): LABPROT, INR in the last 72 hours. ABG No results for input(s): PHART, HCO3 in the last 72 hours.  Invalid input(s): PCO2, PO2  Studies/Results: Mr 3d Recon At Scanner  Result Date: 01/12/2018 CLINICAL DATA:  Right upper quadrant abdominal pain. Cholelithiasis at sonography. EXAM: MRI ABDOMEN WITHOUT AND WITH CONTRAST (INCLUDING MRCP) TECHNIQUE: Multiplanar multisequence MR imaging of the abdomen was performed both before and after the administration of intravenous contrast. Heavily T2-weighted images of the biliary and pancreatic ducts were obtained, and three-dimensional MRCP images were rendered by post processing. CONTRAST:  15mL MULTIHANCE GADOBENATE DIMEGLUMINE 529 MG/ML IV SOLN COMPARISON:  01/12/2018 abdominal sonogram. FINDINGS: Significantly  motion degraded scan particularly on the postcontrast images, limiting assessment. Lower chest: No acute abnormality at the lung bases. Hepatobiliary: Normal liver size and configuration. No significant hepatic steatosis. No liver mass. Multiple subcentimeter gallstones layering in the gallbladder. No significant gallbladder distention. No definite gallbladder wall thickening. No pericholecystic fluid. No biliary ductal dilatation. Common bile duct diameter 6 mm. No evidence of choledocholithiasis. No biliary strictures or masses. Pancreas: No pancreatic mass or duct dilation.  No pancreas divisum. Spleen: Normal size. No mass. Adrenals/Urinary Tract: Normal adrenals. No hydronephrosis. Normal kidneys with no renal mass. Stomach/Bowel: Normal non-distended stomach. Visualized small and large bowel is normal caliber, with no bowel wall thickening. Vascular/Lymphatic: Normal caliber abdominal aorta. Patent portal, splenic, hepatic and renal veins. No pathologically enlarged lymph nodes in the abdomen. Other: No abdominal ascites or focal fluid collection. Musculoskeletal: No aggressive appearing focal osseous lesions. IMPRESSION: 1. Cholelithiasis.  No MRI findings of acute cholecystitis. 2. No intrahepatic biliary ductal dilatation. CBD diameter 6 mm, top-normal. No choledocholithiasis. Electronically Signed   By: Delbert Phenix M.D.   On: 01/12/2018 09:20   Mr Abdomen Mrcp Vivien Rossetti Contast  Result Date: 01/12/2018 CLINICAL DATA:  Right upper quadrant abdominal pain. Cholelithiasis at sonography. EXAM: MRI ABDOMEN WITHOUT AND WITH CONTRAST (INCLUDING MRCP) TECHNIQUE: Multiplanar multisequence MR imaging of the abdomen was performed both before and after the administration of intravenous contrast. Heavily T2-weighted images of the biliary and pancreatic ducts were obtained, and three-dimensional MRCP images were rendered by post processing. CONTRAST:  15mL MULTIHANCE GADOBENATE DIMEGLUMINE 529 MG/ML IV SOLN COMPARISON:   01/12/2018 abdominal sonogram. FINDINGS:  Significantly motion degraded scan particularly on the postcontrast images, limiting assessment. Lower chest: No acute abnormality at the lung bases. Hepatobiliary: Normal liver size and configuration. No significant hepatic steatosis. No liver mass. Multiple subcentimeter gallstones layering in the gallbladder. No significant gallbladder distention. No definite gallbladder wall thickening. No pericholecystic fluid. No biliary ductal dilatation. Common bile duct diameter 6 mm. No evidence of choledocholithiasis. No biliary strictures or masses. Pancreas: No pancreatic mass or duct dilation.  No pancreas divisum. Spleen: Normal size. No mass. Adrenals/Urinary Tract: Normal adrenals. No hydronephrosis. Normal kidneys with no renal mass. Stomach/Bowel: Normal non-distended stomach. Visualized small and large bowel is normal caliber, with no bowel wall thickening. Vascular/Lymphatic: Normal caliber abdominal aorta. Patent portal, splenic, hepatic and renal veins. No pathologically enlarged lymph nodes in the abdomen. Other: No abdominal ascites or focal fluid collection. Musculoskeletal: No aggressive appearing focal osseous lesions. IMPRESSION: 1. Cholelithiasis.  No MRI findings of acute cholecystitis. 2. No intrahepatic biliary ductal dilatation. CBD diameter 6 mm, top-normal. No choledocholithiasis. Electronically Signed   By: Delbert PhenixJason A Poff M.D.   On: 01/12/2018 09:20   [place] Abdomen Limited Ruq  Result Date: 01/12/2018 CLINICAL DATA:  Right upper quadrant pain. EXAM: ULTRASOUND ABDOMEN LIMITED RIGHT UPPER QUADRANT COMPARISON:  January 03, 2018 FINDINGS: Gallbladder: Cholelithiasis is identified. The largest stone is seen in the neck of the gallbladder measuring 1.1 cm. Sludge is identified as well. No Murphy's sign, pericholecystic fluid, or wall thickening. Common bile duct: Diameter: 5.1 mm Liver: No focal lesion identified. Within normal limits in parenchymal echogenicity.  Portal vein is patent on color Doppler imaging with normal direction of blood flow towards the liver. IMPRESSION: 1. Cholelithiasis and sludge in the gallbladder as above with no wall thickening, pericholecystic fluid, or Murphy's sign. If concern persists, a HIDA scan could further evaluate. Electronically Signed   By: Gerome Samavid  Williams III M.D   On: 01/12/2018 07:29    Anti-infectives: Anti-infectives (From admission, onward)   Start     Dose/Rate Route Frequency Ordered Stop   01/13/18 0715  ceFAZolin (ANCEF) IVPB 2g/100 mL premix     2 g 200 mL/hr over 30 Minutes Intravenous On call to O.R. 01/13/18 0706 01/14/18 0559   01/12/18 0715  piperacillin-tazobactam (ZOSYN) IVPB 3.375 g     3.375 g 100 mL/hr over 30 Minutes Intravenous  Once 01/12/18 0714 01/12/18 0837      Assessment/Plan: s/p Procedure(s): LAPAROSCOPIC CHOLECYSTECTOMY WITH INTRAOPERATIVE CHOLANGIOGRAM (N/A) Labs and exam have improved. I recommend proceeding with laparoscopic cholecystectomy with possible cholangiogram. Discussed risks of surgery including bleeding, pain, scarring, intraabdominal injury specifically to the common bile duct and sequelae, conversion to open surgery, blood clot, pneumonia, heart attack, stroke, failure to resolve symptoms, etc/ Questions welcomed and answered. She agrees with the plan and we will proceed this morning.    LOS: 1 day    Berna BueChelsea A Deztiny Sarra 01/13/2018

## 2018-01-13 NOTE — Transfer of Care (Signed)
Immediate Anesthesia Transfer of Care Note  Patient: Kelli Washington  Procedure(s) Performed: LAPAROSCOPIC CHOLECYSTECTOMY WITH INTRAOPERATIVE CHOLANGIOGRAM (N/A Abdomen)  Patient Location: PACU  Anesthesia Type:General  Level of Consciousness: sedated  Airway & Oxygen Therapy: Patient Spontanous Breathing and Patient connected to face mask oxygen  Post-op Assessment: Report given to RN and Post -op Vital signs reviewed and stable  Post vital signs: Reviewed and stable  Last Vitals:  Vitals Value Taken Time  BP    Temp    Pulse    Resp    SpO2      Last Pain:  Vitals:   01/13/18 0615  TempSrc: Oral  PainSc:       Patients Stated Pain Goal: 3 (01/12/18 0954)  Complications: No apparent anesthesia complications

## 2018-01-13 NOTE — Progress Notes (Signed)
[facility] Progress Note  Patient: Kelli Washington NGE:[id number]   PCP: Orland Mustard, MD DOB: 01/13/1981   DOA: 01/12/2018   DOS: 01/13/2018   Date of Service: the patient was seen and examined on 01/13/2018  Subjective: Seen after the surgery.  Feels better.  No nausea no vomiting.  Pain well controlled.  Brief hospital course: Pt. with PMH of fibromyalgia; admitted on 01/12/2018, presented with complaint of abdominal pain, was found to have gallstone pancreatitis.  General surgery consulted, MRCP was negative.  Patient underwent lap chole. Currently further plan is monitor postoperative course.  Assessment and Plan: 1.  Gallstone pancreatitis. Mildly elevated LFT. LFTs are better.  Pain coronaries appears to have improved as well. Continue IV hydration. Underwent MRCP which was negative for any CBD stone. General surgery was consulted and patient underwent lap chole. Monitor postoperative course. No indication for antibiotic for now.  2.  Fibromyalgia. We will resume Effexor tomorrow. Outpatient follow-up.  Diet: npo DVT Prophylaxis: subcutaneous Heparin Advance goals of care discussion: full code  Family Communication: no family was present at bedside, at the time of interview.   Disposition:  Discharge to home.  Consultants: General surgery  Procedures: lap chole  Antibiotics: Anti-infectives (From admission, onward)   Start     Dose/Rate Route Frequency Ordered Stop   01/13/18 0815  cefoTEtan (CEFOTAN) 2 g in sodium chloride 0.9 % 100 mL IVPB     2 g 200 mL/hr over 30 Minutes Intravenous On call to O.R. 01/13/18 0800 01/13/18 0910   01/13/18 0803  sodium chloride 0.9 % with cefoTEtan (CEFOTAN) ADS Med    Note to Pharmacy:  Juanda Crumble   : cabinet override      01/13/18 0803 01/13/18 0855   01/13/18 0715  ceFAZolin (ANCEF) IVPB 2g/100 mL premix     2 g 200 mL/hr over 30 Minutes Intravenous On call to O.R. 01/13/18 0706 01/14/18 0559   01/12/18 0715   piperacillin-tazobactam (ZOSYN) IVPB 3.375 g     3.375 g 100 mL/hr over 30 Minutes Intravenous  Once 01/12/18 0714 01/12/18 0837       Objective: Physical Exam: Vitals:   01/13/18 1015 01/13/18 1023 01/13/18 1045 01/13/18 1108  BP: 130/84 133/75 128/79 112/77  Pulse: 71 66  (!) 59  Resp: 17 18  14   Temp:   98.1 F (36.7 C) (!) 97.4 F (36.3 C)  TempSrc:    Oral  SpO2: 99% 100%  99%  Weight:      Height:        Intake/Output Summary (Last 24 hours) at 01/13/2018 1541 Last data filed at 01/13/2018 1045 Gross per 24 hour  Intake 6620 ml  Output 195 ml  Net 6425 ml   Filed Weights   01/12/18 0315 01/12/18 0954  Weight: 73 kg (161 lb) 72.6 kg (160 lb)   General: Alert, Awake and Oriented to Time, Place and Person. Appear in mild distress, affect appropriate Eyes: PERRL, Conjunctiva normal ENT: Oral Mucosa clear moist. Neck: no JVD, no Abnormal Mass Or lumps Cardiovascular: S1 and S2 Present, no Murmur, Peripheral Pulses Present Respiratory: normal respiratory effort, Bilateral Air entry equal and Decreased, no use of accessory muscle, Clear to Auscultation, no Crackles, on wheezes Abdomen: Bowel Sound present, Soft and mild tenderness, no hernia Skin: no redness, no Rash, no induration Extremities: no Pedal edema, no calf tenderness Neurologic: Grossly no focal neuro deficit. Bilaterally Equal motor strength  Data Reviewed: CBC: Recent Labs  Lab 01/12/18 0530 01/13/18 0417  WBC 13.0* 7.7  HGB 15.9* 13.5  HCT 46.2* 40.9  MCV 89.5 90.9  PLT 177 148*   Basic Metabolic Panel: Recent Labs  Lab 01/12/18 0530 01/13/18 0417  NA 138 138  K 3.8 3.7  CL 102 109  CO2 27 21*  GLUCOSE 109* 79  BUN 15 6  CREATININE 0.87 0.78  CALCIUM 9.2 8.1*    Liver Function Tests: Recent Labs  Lab 01/12/18 0530 01/13/18 0417  AST 100* 30  ALT 76* 43  ALKPHOS 81 59  BILITOT 1.0 0.9  PROT 7.2 5.2*  ALBUMIN 4.0 2.8*   Recent Labs  Lab 01/12/18 0530 01/13/18 0417  LIPASE  1,187* 41   No results for input(s): AMMONIA in the last 168 hours. Coagulation Profile: No results for input(s): INR, PROTIME in the last 168 hours. Cardiac Enzymes: No results for input(s): CKTOTAL, CKMB, CKMBINDEX, TROPONINI in the last 168 hours. BNP (last 3 results) No results for input(s): PROBNP in the last 8760 hours. CBG: No results for input(s): GLUCAP in the last 168 hours. Studies: Dg Cholangiogram Operative  Result Date: 01/13/2018 CLINICAL DATA:  Intraoperative cholangiogram during laparoscopic cholecystectomy. EXAM: INTRAOPERATIVE CHOLANGIOGRAM FLUOROSCOPY TIME:  11 seconds (2.31 mGy) COMPARISON:  MRCP - 01/12/2018 FINDINGS: Intraoperative cholangiographic images of the right upper abdominal quadrant during laparoscopic cholecystectomy are provided for review. Surgical clips overlie the expected location of the gallbladder fossa. Contrast injection demonstrates selective cannulation of the central aspect of the cystic duct. There is passage of contrast through the central aspect of the cystic duct with filling of a non dilated common bile duct. There is passage of contrast though the CBD and into the descending portion of the duodenum. There is minimal reflux of injected contrast into the common hepatic duct and central aspect of the non dilated intrahepatic biliary system. There is minimal opacification of the central aspect the pancreatic duct which appears nondilated. There are no discrete filling defects within the opacified portions of the biliary system to suggest the presence of choledocholithiasis. IMPRESSION: No evidence of choledocholithiasis. Electronically Signed   By: Simonne ComeJohn  Watts M.D.   On: 01/13/2018 10:25    Scheduled Meds: . fentaNYL      . venlafaxine XR  75 mg Oral Q breakfast   Continuous Infusions: . sodium chloride 200 mL/hr at 01/13/18 1143  .  ceFAZolin (ANCEF) IV     PRN Meds: acetaminophen **OR** acetaminophen, HYDROmorphone (DILAUDID) injection,  ibuprofen, ondansetron **OR** ondansetron (ZOFRAN) IV, traMADol  Time spent: 35 minutes  Author: Lynden OxfordPranav Kaden Daughdrill, MD [facility] Pager: [phone] 01/13/2018 3:41 PM  If 7PM-7AM, please contact night-coverage at [email or web address], password [email or web address]

## 2018-01-13 NOTE — Anesthesia Preprocedure Evaluation (Signed)
Anesthesia Evaluation  Patient identified by MRN, date of birth, ID band Patient awake    Reviewed: Allergy & Precautions, NPO status , Patient's Chart, lab work & pertinent test results  Airway Mallampati: II  TM Distance: >3 FB Neck ROM: Full    Dental  (+) Dental Advisory Given   Pulmonary asthma , Current Smoker,    breath sounds clear to auscultation       Cardiovascular negative cardio ROS   Rhythm:Regular Rate:Normal     Neuro/Psych Anxiety negative neurological ROS     GI/Hepatic negative GI ROS, Neg liver ROS,   Endo/Other  negative endocrine ROS  Renal/GU negative Renal ROS     Musculoskeletal  (+) Arthritis , Fibromyalgia -  Abdominal   Peds  Hematology negative hematology ROS (+)   Anesthesia Other Findings   Reproductive/Obstetrics                             Lab Results  Component Value Date   WBC 7.7 01/13/2018   HGB 13.5 01/13/2018   HCT 40.9 01/13/2018   MCV 90.9 01/13/2018   PLT 148 (L) 01/13/2018   Lab Results  Component Value Date   CREATININE 0.78 01/13/2018   BUN 6 01/13/2018   NA 138 01/13/2018   K 3.7 01/13/2018   CL 109 01/13/2018   CO2 21 (L) 01/13/2018    Anesthesia Physical Anesthesia Plan  ASA: II  Anesthesia Plan: General   Post-op Pain Management:    Induction: Intravenous  PONV Risk Score and Plan: 2 and Dexamethasone, Ondansetron and Treatment may vary due to age or medical condition  Airway Management Planned: Oral ETT  Additional Equipment:   Intra-op Plan:   Post-operative Plan: Extubation in OR  Informed Consent: I have reviewed the patients History and Physical, chart, labs and discussed the procedure including the risks, benefits and alternatives for the proposed anesthesia with the patient or authorized representative who has indicated his/her understanding and acceptance.   Dental advisory given  Plan Discussed with:  CRNA  Anesthesia Plan Comments:         Anesthesia Quick Evaluation

## 2018-01-14 ENCOUNTER — Encounter (HOSPITAL_COMMUNITY): Payer: Self-pay | Admitting: Surgery

## 2018-01-14 LAB — CBC
HCT: 43.4 % (ref 36.0–46.0)
HEMOGLOBIN: 14.6 g/dL (ref 12.0–15.0)
MCH: 30.3 pg (ref 26.0–34.0)
MCHC: 33.6 g/dL (ref 30.0–36.0)
MCV: 90 fL (ref 78.0–100.0)
PLATELETS: 180 10*3/uL (ref 150–400)
RBC: 4.82 MIL/uL (ref 3.87–5.11)
RDW: 13.1 % (ref 11.5–15.5)
WBC: 18.6 10*3/uL — AB (ref 4.0–10.5)

## 2018-01-14 LAB — COMPREHENSIVE METABOLIC PANEL
ALT: 63 U/L — ABNORMAL HIGH (ref 0–44)
ANION GAP: 10 (ref 5–15)
AST: 50 U/L — ABNORMAL HIGH (ref 15–41)
Albumin: 3.4 g/dL — ABNORMAL LOW (ref 3.5–5.0)
Alkaline Phosphatase: 69 U/L (ref 38–126)
BUN: 7 mg/dL (ref 6–20)
CHLORIDE: 102 mmol/L (ref 98–111)
CO2: 27 mmol/L (ref 22–32)
CREATININE: 0.79 mg/dL (ref 0.44–1.00)
Calcium: 9.1 mg/dL (ref 8.9–10.3)
Glucose, Bld: 98 mg/dL (ref 70–99)
POTASSIUM: 3.7 mmol/L (ref 3.5–5.1)
SODIUM: 139 mmol/L (ref 135–145)
Total Bilirubin: 0.7 mg/dL (ref 0.3–1.2)
Total Protein: 6.6 g/dL (ref 6.5–8.1)

## 2018-01-14 MED ORDER — TRAMADOL HCL 50 MG PO TABS: 50.0000 mg | ORAL_TABLET | Freq: Four times a day (QID) | ORAL | 0 refills | Status: DC | PRN

## 2018-01-14 MED ORDER — METHOCARBAMOL 500 MG PO TABS: 500.0000 mg | ORAL_TABLET | Freq: Three times a day (TID) | ORAL | 0 refills | Status: DC | PRN

## 2018-01-14 MED ORDER — ACETAMINOPHEN 500 MG PO TABS
1000.0000 mg | ORAL_TABLET | Freq: Three times a day (TID) | ORAL | Status: DC
  Administered 2018-01-14: 1000 mg via ORAL
  Filled 2018-01-14: qty 2

## 2018-01-14 MED ORDER — OXYCODONE HCL 5 MG PO TABS: 5.0000 mg | ORAL_TABLET | ORAL | Status: DC | PRN

## 2018-01-14 NOTE — Progress Notes (Signed)
Discussed patient's fluids with Dr. Allena KatzPatel. Instructed to stop fluids since patient is tolerating po intake well with no distress noted will continue to monitor.

## 2018-01-14 NOTE — Progress Notes (Signed)
Patient is alert and orientedX4. Denies all pain since this morning. Informed Dr. Allena KatzPatel and Wells GuilesKelly Rayburn. No new orders at this time. Will continue to monitor.

## 2018-01-14 NOTE — Progress Notes (Signed)
[facility] Progress Note  1 Day Post-Op  Subjective: CC: soreness  Patient reports soreness at incisions. Tolerating regular diet, denies nausea. No flatus yet. Pain medication does help.   Objective: Vital signs in last 24 hours: Temp:  [97.4 F (36.3 C)-98.1 F (36.7 C)] 98.1 F (36.7 C) (07/08 0630) Pulse Rate:  [59-71] 68 (07/08 0630) Resp:  [14-18] 18 (07/08 0630) BP: (112-135)/(74-84) 123/74 (07/08 0630) SpO2:  [97 %-100 %] 97 % (07/08 0630) Last BM Date: 01/11/18  Intake/Output from previous day: 07/07 0701 - 07/08 0700 In: 6220 [I.V.:4620; IV Piggyback:100] Out: 195 [Urine:175; Blood:20] Intake/Output this shift: No intake/output data recorded.  PE: Gen:  Alert, NAD, pleasant Card:  Regular rate and rhythm, pedal pulses 2+ BL Pulm:  Normal effort, clear to auscultation bilaterally Abd: Soft, appropriately tender, non-distended, bowel sounds present, no HSM, incisions C/D/I Skin: warm and dry, no rashes  Psych: A&Ox3   Lab Results:  Recent Labs    01/13/18 0417 01/14/18 0744  WBC 7.7 18.6*  HGB 13.5 14.6  HCT 40.9 43.4  PLT 148* 180   BMET Recent Labs    01/13/18 0417 01/14/18 0744  NA 138 139  K 3.7 3.7  CL 109 102  CO2 21* 27  GLUCOSE 79 98  BUN 6 7  CREATININE 0.78 0.79  CALCIUM 8.1* 9.1   PT/INR No results for input(s): LABPROT, INR in the last 72 hours. CMP     Component Value Date/Time   NA 139 01/14/2018 0744   K 3.7 01/14/2018 0744   CL 102 01/14/2018 0744   CO2 27 01/14/2018 0744   GLUCOSE 98 01/14/2018 0744   BUN 7 01/14/2018 0744   CREATININE 0.79 01/14/2018 0744   CALCIUM 9.1 01/14/2018 0744   PROT 6.6 01/14/2018 0744   ALBUMIN 3.4 (L) 01/14/2018 0744   AST 50 (H) 01/14/2018 0744   ALT 63 (H) 01/14/2018 0744   ALKPHOS 69 01/14/2018 0744   BILITOT 0.7 01/14/2018 0744   GFRNONAA >60 01/14/2018 0744   GFRAA >60 01/14/2018 0744   Lipase     Component Value Date/Time   LIPASE 41 01/13/2018 0417        Studies/Results: Dg Cholangiogram Operative  Result Date: 01/13/2018 CLINICAL DATA:  Intraoperative cholangiogram during laparoscopic cholecystectomy. EXAM: INTRAOPERATIVE CHOLANGIOGRAM FLUOROSCOPY TIME:  11 seconds (2.31 mGy) COMPARISON:  MRCP - 01/12/2018 FINDINGS: Intraoperative cholangiographic images of the right upper abdominal quadrant during laparoscopic cholecystectomy are provided for review. Surgical clips overlie the expected location of the gallbladder fossa. Contrast injection demonstrates selective cannulation of the central aspect of the cystic duct. There is passage of contrast through the central aspect of the cystic duct with filling of a non dilated common bile duct. There is passage of contrast though the CBD and into the descending portion of the duodenum. There is minimal reflux of injected contrast into the common hepatic duct and central aspect of the non dilated intrahepatic biliary system. There is minimal opacification of the central aspect the pancreatic duct which appears nondilated. There are no discrete filling defects within the opacified portions of the biliary system to suggest the presence of choledocholithiasis. IMPRESSION: No evidence of choledocholithiasis. Electronically Signed   By: Simonne Come M.D.   On: 01/13/2018 10:25   Mr 3d Recon At Scanner  Result Date: 01/12/2018 CLINICAL DATA:  Right upper quadrant abdominal pain. Cholelithiasis at sonography. EXAM: MRI ABDOMEN WITHOUT AND WITH CONTRAST (INCLUDING MRCP) TECHNIQUE: Multiplanar multisequence MR imaging of the abdomen was  performed both before and after the administration of intravenous contrast. Heavily T2-weighted images of the biliary and pancreatic ducts were obtained, and three-dimensional MRCP images were rendered by post processing. CONTRAST:  15mL MULTIHANCE GADOBENATE DIMEGLUMINE 529 MG/ML IV SOLN COMPARISON:  01/12/2018 abdominal sonogram. FINDINGS: Significantly motion degraded scan  particularly on the postcontrast images, limiting assessment. Lower chest: No acute abnormality at the lung bases. Hepatobiliary: Normal liver size and configuration. No significant hepatic steatosis. No liver mass. Multiple subcentimeter gallstones layering in the gallbladder. No significant gallbladder distention. No definite gallbladder wall thickening. No pericholecystic fluid. No biliary ductal dilatation. Common bile duct diameter 6 mm. No evidence of choledocholithiasis. No biliary strictures or masses. Pancreas: No pancreatic mass or duct dilation.  No pancreas divisum. Spleen: Normal size. No mass. Adrenals/Urinary Tract: Normal adrenals. No hydronephrosis. Normal kidneys with no renal mass. Stomach/Bowel: Normal non-distended stomach. Visualized small and large bowel is normal caliber, with no bowel wall thickening. Vascular/Lymphatic: Normal caliber abdominal aorta. Patent portal, splenic, hepatic and renal veins. No pathologically enlarged lymph nodes in the abdomen. Other: No abdominal ascites or focal fluid collection. Musculoskeletal: No aggressive appearing focal osseous lesions. IMPRESSION: 1. Cholelithiasis.  No MRI findings of acute cholecystitis. 2. No intrahepatic biliary ductal dilatation. CBD diameter 6 mm, top-normal. No choledocholithiasis. Electronically Signed   By: Delbert Phenix M.D.   On: 01/12/2018 09:20   Mr Abdomen Mrcp Vivien Rossetti Contast  Result Date: 01/12/2018 CLINICAL DATA:  Right upper quadrant abdominal pain. Cholelithiasis at sonography. EXAM: MRI ABDOMEN WITHOUT AND WITH CONTRAST (INCLUDING MRCP) TECHNIQUE: Multiplanar multisequence MR imaging of the abdomen was performed both before and after the administration of intravenous contrast. Heavily T2-weighted images of the biliary and pancreatic ducts were obtained, and three-dimensional MRCP images were rendered by post processing. CONTRAST:  15mL MULTIHANCE GADOBENATE DIMEGLUMINE 529 MG/ML IV SOLN COMPARISON:  01/12/2018 abdominal  sonogram. FINDINGS: Significantly motion degraded scan particularly on the postcontrast images, limiting assessment. Lower chest: No acute abnormality at the lung bases. Hepatobiliary: Normal liver size and configuration. No significant hepatic steatosis. No liver mass. Multiple subcentimeter gallstones layering in the gallbladder. No significant gallbladder distention. No definite gallbladder wall thickening. No pericholecystic fluid. No biliary ductal dilatation. Common bile duct diameter 6 mm. No evidence of choledocholithiasis. No biliary strictures or masses. Pancreas: No pancreatic mass or duct dilation.  No pancreas divisum. Spleen: Normal size. No mass. Adrenals/Urinary Tract: Normal adrenals. No hydronephrosis. Normal kidneys with no renal mass. Stomach/Bowel: Normal non-distended stomach. Visualized small and large bowel is normal caliber, with no bowel wall thickening. Vascular/Lymphatic: Normal caliber abdominal aorta. Patent portal, splenic, hepatic and renal veins. No pathologically enlarged lymph nodes in the abdomen. Other: No abdominal ascites or focal fluid collection. Musculoskeletal: No aggressive appearing focal osseous lesions. IMPRESSION: 1. Cholelithiasis.  No MRI findings of acute cholecystitis. 2. No intrahepatic biliary ductal dilatation. CBD diameter 6 mm, top-normal. No choledocholithiasis. Electronically Signed   By: Delbert Phenix M.D.   On: 01/12/2018 09:20    Anti-infectives: Anti-infectives (From admission, onward)   Start     Dose/Rate Route Frequency Ordered Stop   01/13/18 0815  cefoTEtan (CEFOTAN) 2 g in sodium chloride 0.9 % 100 mL IVPB     2 g 200 mL/hr over 30 Minutes Intravenous On call to O.R. 01/13/18 0800 01/13/18 0910   01/13/18 0803  sodium chloride 0.9 % with cefoTEtan (CEFOTAN) ADS Med    Note to Pharmacy:  Juanda Crumble   : cabinet override  01/13/18 0803 01/13/18 0855   01/13/18 0715  ceFAZolin (ANCEF) IVPB 2g/100 mL premix     2 g 200 mL/hr over  30 Minutes Intravenous On call to O.R. 01/13/18 0706 01/14/18 0559   01/12/18 0715  piperacillin-tazobactam (ZOSYN) IVPB 3.375 g     3.375 g 100 mL/hr over 30 Minutes Intravenous  Once 01/12/18 0714 01/12/18 0837       Assessment/Plan Gallstone pancreatitis - s/p lap chole with IOC 01/13/18 Dr. Fredricka Bonineonnor - POD#1 - tolerating diet  - pt still sore - will schedule tylenol and prn oxy IR for better pain control - Tbili 0.7 and AST/ALT only mildly elevated which is expected post-op - WBC elevated at 18.6, afebrile - likely just post-op inflammatory response - stable for discharge later today if pain control improves - will arrange follow up and include in chart  FEN: regular diet VTE: SCDs ID: Zosyn 7/6, ancef/cefotetan periop  LOS: 2 days    Wells GuilesKelly Rayburn , [facility] 01/14/2018, 9:02 AM Pager: [phone] Consults: [phone] Mon-Fri 7:00 am-4:30 pm Sat-Sun 7:00 am-11:30 am

## 2018-01-14 NOTE — Discharge Instructions (Signed)
Please arrive at least 30 min before your appointment to complete your check in paperwork.  If you are unable to arrive 30 min prior to your appointment time we may have to cancel or reschedule you. ° °LAPAROSCOPIC SURGERY: POST OP INSTRUCTIONS  °1. DIET: Follow a light bland diet the first 24 hours after arrival home, such as soup, liquids, crackers, etc. Be sure to include lots of fluids daily. Avoid fast food or heavy meals as your are more likely to get nauseated. Eat a low fat the next few days after surgery.  °2. Take your usually prescribed home medications unless otherwise directed. °3. PAIN CONTROL:  °1. Pain is best controlled by a usual combination of three different methods TOGETHER:  °1. Ice/Heat °2. Over the counter pain medication °3. Prescription pain medication °2. Most patients will experience some swelling and bruising around the incisions. Ice packs or heating pads (30-60 minutes up to 6 times a day) will help. Use ice for the first few days to help decrease swelling and bruising, then switch to heat to help relax tight/sore spots and speed recovery. Some people prefer to use ice alone, heat alone, alternating between ice & heat. Experiment to what works for you. Swelling and bruising can take several weeks to resolve.  °3. It is helpful to take an over-the-counter pain medication regularly for the first few weeks. Choose one of the following that works best for you:  °1. Naproxen (Aleve, etc) Two 220mg tabs twice a day °2. Ibuprofen (Advil, etc) Three 200mg tabs four times a day (every meal & bedtime) °3. Acetaminophen (Tylenol, etc) 500-650mg four times a day (every meal & bedtime) °4. A prescription for pain medication (such as oxycodone, hydrocodone, etc) should be given to you upon discharge. Take your pain medication as prescribed.  °1. If you are having problems/concerns with the prescription medicine (does not control pain, nausea, vomiting, rash, itching, etc), please call us [phone]  [phone] to see if we need to switch you to a different pain medicine that will work better for you and/or control your side effect better. °2. If you need a refill on your pain medication, please contact your pharmacy. They will contact our office to request authorization. Prescriptions will not be filled after 5 pm or on week-ends. °4. Avoid getting constipated. Between the surgery and the pain medications, it is common to experience some constipation. Increasing fluid intake and taking a fiber supplement (such as Metamucil, Citrucel, FiberCon, MiraLax, etc) 1-2 times a day regularly will usually help prevent this problem from occurring. A mild laxative (prune juice, Milk of Magnesia, MiraLax, etc) should be taken according to package directions if there are no bowel movements after 48 hours.  °5. Watch out for diarrhea. If you have many loose bowel movements, simplify your diet to bland foods & liquids for a few days. Stop any stool softeners and decrease your fiber supplement. Switching to mild anti-diarrheal medications (Kayopectate, Pepto Bismol) can help. If this worsens or does not improve, please call us. °6. Wash / shower every day. You may shower over the dressings as they are waterproof. Continue to shower over incision(s) after the dressing is off. °7. Remove your waterproof bandages 5 days after surgery. You may leave the incision open to air. You may replace a dressing/Band-Aid to cover the incision for comfort if you wish.  °8. ACTIVITIES as tolerated:  °1. You may resume regular (light) daily activities beginning the next day--such as daily self-care, walking, climbing stairs--gradually   increasing activities as tolerated. If you can walk 30 minutes without difficulty, it is safe to try more intense activity such as jogging, treadmill, bicycling, low-impact aerobics, swimming, etc. °2. Save the most intensive and strenuous activity for last such as sit-ups, heavy lifting, contact sports, etc Refrain  from any heavy lifting or straining until you are off narcotics for pain control.  °3. DO NOT PUSH THROUGH PAIN. Let pain be your guide: If it hurts to do something, don't do it. Pain is your body warning you to avoid that activity for another week until the pain goes down. °4. You may drive when you are no longer taking prescription pain medication, you can comfortably wear a seatbelt, and you can safely maneuver your car and apply brakes. °5. You may have sexual intercourse when it is comfortable.  °9. FOLLOW UP in our office  °1. Please call [facility] at [phone] to set up an appointment to see your surgeon in the office for a follow-up appointment approximately 2-3 weeks after your surgery. °2. Make sure that you call for this appointment the day you arrive home to insure a convenient appointment time. °     10. IF YOU HAVE DISABILITY OR FAMILY LEAVE FORMS, BRING THEM TO THE               OFFICE FOR PROCESSING.  ° °WHEN TO CALL US [phone]:  °1. Poor pain control °2. Reactions / problems with new medications (rash/itching, nausea, etc)  °3. Fever over 101.5 F (38.5 C) °4. Inability to urinate °5. Nausea and/or vomiting °6. Worsening swelling or bruising °7. Continued bleeding from incision. °8. Increased pain, redness, or drainage from the incision ° °The clinic staff is available to answer your questions during regular business hours (8:30am-5pm). Please don’t hesitate to call and ask to speak to one of our nurses for clinical concerns.  °If you have a medical emergency, go to the nearest emergency room or call 911.  °A surgeon from [facility] is always on call at the hospitals  ° °[facility]  °[address], [address], [place], [place] [address] ?  °MAIN: [phone] ? TOLL FREE: [phone] ?  °FAX [phone]  °[email or web address] ° °

## 2018-01-15 ENCOUNTER — Telehealth: Payer: Self-pay | Admitting: *Deleted

## 2018-01-15 ENCOUNTER — Telehealth: Payer: Self-pay | Admitting: Obstetrics and Gynecology

## 2018-01-15 NOTE — Telephone Encounter (Signed)
Per chart review: Brief hospital course: Pt. with PMH of fibromyalgia; admitted on 01/12/2018, presented with complaint of abdominal pain, was found to have gallstone pancreatitis.  General surgery consulted, MRCP was negative.  Patient underwent lap chole. Currently further plan is monitor postoperative course.  Assessment and Plan: 1.  Gallstone pancreatitis. Mildly elevated LFT. LFTs are better.  Pain coronaries appears to have improved as well. Continue IV hydration. Underwent MRCP which was negative for any CBD stone. General surgery was consulted and patient underwent lap chole. Monitor postoperative course. No indication for antibiotic for now.  2.  Fibromyalgia. We will resume Effexor tomorrow. Outpatient follow-up.  Diet: npo DVT Prophylaxis: subcutaneous Heparin Advance goals of care discussion: full code  Family Communication: no family was present at bedside, at the time of interview.   Disposition:  Discharge to home.  Consultants: General surgery  Procedures: lap chole _________________________________________________________________ Per telephone call: Transition Care Management Follow-up Telephone Call   Date discharged? 01/14/18   How have you been since you were released from the hospital? "alright"   Do you understand why you were in the hospital? yes   Do you understand the discharge instructions? yes   Where were you discharged to? Home   Items Reviewed:  Medications reviewed: yes  Allergies reviewed: yes  Dietary changes reviewed: yes  Referrals reviewed: yes   Functional Questionnaire:   Activities of Daily Living (ADLs):   She states they are independent in the following: ambulation, bathing and hygiene, feeding, continence, grooming, toileting and dressing States they require assistance with the following: none   Any transportation issues/concerns?: no   Any patient concerns? no   Confirmed importance and date/time  of follow-up visits scheduled yes  Provider Appointment booked with Dr Artis FlockWolfe 01/23/18  Confirmed with patient if condition begins to worsen call PCP or go to the ER.  Patient was given the office number and encouraged to call back with question or concerns.  : yes

## 2018-01-15 NOTE — Telephone Encounter (Signed)
Left message to call Noreene LarssonJill at [phone].  Reschedule Mirena IUD insertion and Gardasil

## 2018-01-15 NOTE — Telephone Encounter (Signed)
Patient returned call, states she had gallbladder removed on Sunday and would like to reschedule appointment for 01/17/18. Per Epic previous IUD has been removed, no current contraception. Advised patient to call back to reschedule IUD insertion and Gardasil injection with start of next cycle.   Routing to provider for final review. Patient agreeable to disposition. Will close encounter.

## 2018-01-15 NOTE — Telephone Encounter (Signed)
Patient cancelled procedure scheduled for 01/17/18 and needs to reschedule.

## 2018-01-17 ENCOUNTER — Ambulatory Visit: Payer: Managed Care, Other (non HMO) | Admitting: Obstetrics and Gynecology

## 2018-01-18 NOTE — Discharge Summary (Signed)
[facility] Discharge Summary   Patient: Kelli Washington ZOX:[id number]   PCP: Orland Mustard, MD DOB: 1980-10-11   Date of admission: 01/12/2018   Date of discharge: 01/14/2018    Discharge Diagnoses:  Principal Problem:   Acute gallstone pancreatitis Active Problems:   Leukocytosis   Elevated LFTs   Admitted From: home Disposition:  home  Recommendations for Outpatient Follow-up:  1. Please follow up with General surgery    Follow-up Information    Surgery, [facility]. Go on 01/29/2018.   Specialty:  General Surgery Why:  Follow up appointment scheduled for 11:45 AM. Please arrive 30 min prior to appointment time. Bring photo ID and insurance information.  Contact information: [address] [place] [address] [phone]        Orland Mustard, MD. Schedule an appointment as soon as possible for a visit in 1 week(s).   Specialty:  Family Medicine Contact information: [address] [place] [address] [phone]          Diet recommendation: soft diet  Activity: The patient is advised to gradually reintroduce usual activities.  Discharge Condition: good  Code Status: full code  History of present illness: As per the H and P dictated on admission, "Lanelle Lindo is a 37 y.o. female with medical history significant of fibromyalgia, recently diagnosed gallstones presented with abdominal pain.  Patient states that the abdominal pain started last night, mostly in the right upper quadrant and epigastric pain which began several hours after eating along with nausea and vomiting.  Her pain progressively got worse, was intermittent and was up to 7 out of 10 in intensity.  No aggravating or relieving factors no radiation.  No fevers, chills, diarrhea, constipation, dysuria, increased frequency of urination, shortness of breath, cough, chest pain.  ED Course: She was found to have elevated lipase at 1187 and mildly elevated LFTs and  mild leukocytosis.  Right upper quadrant ultrasound showed cholelithiasis but no signs of cholecystitis.  ED provider spoke to on-call GI provider who recommended MRCP and if positive for CBD stones, patient would need ERCP but if not, patient would need surgical evaluation.  Hospitalist service was called to evaluate the patient."  Hospital Course:  Summary of her active problems in the hospital is as following. 1.  Gallstone pancreatitis. Mildly elevated LFT. LFTs are better.  Pain appears to have improved as well. Underwent MRCP which was negative for any CBD stone. General surgery was consulted and patient underwent lap chole. Postoperatively doing well.  General surgery felt patient can be discharged from their perspective. 2.  Fibromyalgia. We will resume Effexor. Outpatient follow-up.   All other chronic medical condition were stable during the hospitalization.  Patient was ambulatory without any assistance. On the day of the discharge the patient's vitals were stable , and no other acute medical condition were reported by patient. the patient was felt safe to be discharge at home with family.  Consultants: General surgery  Gastroenterology  Procedures: lap chole  DISCHARGE MEDICATION: Allergies as of 01/14/2018   No Known Allergies     Medication List    STOP taking these medications   HYDROcodone-acetaminophen 5-325 MG tablet Commonly known as:  NORCO/VICODIN     TAKE these medications   methocarbamol 500 MG tablet Commonly known as:  ROBAXIN Take 1 tablet (500 mg total) by mouth every 8 (eight) hours as needed for muscle spasms.   ondansetron 4 MG disintegrating tablet Commonly known as:  ZOFRAN ODT Take  1 tablet (4 mg total) by mouth every 8 (eight) hours as needed for nausea or vomiting.   traMADol 50 MG tablet Commonly known as:  ULTRAM Take 1 tablet (50 mg total) by mouth every 6 (six) hours as needed for moderate pain.   venlafaxine XR 75 MG 24 hr  capsule Commonly known as:  EFFEXOR-XR Take 1 capsule (75 mg total) by mouth daily with breakfast.      No Known Allergies Discharge Instructions    Diet fat modified   Complete by:  As directed    Discharge instructions   Complete by:  As directed    It is important that you read following instructions as well as go over your medication list with RN to help you understand your care after this hospitalization.  Discharge Instructions: Please follow-up with PCP in one week  Please request your primary care physician to go over all Hospital Tests and Procedure/Radiological results at the follow up,  Please get all Hospital records sent to your PCP by signing hospital release before you go home.   Do not drive, operating heavy machinery, perform activities at heights, swimming or participation in water activities or provide baby sitting services while you are on Pain, Sleep and Anxiety Medications; until you have been seen by Primary Care Physician or a Neurologist and advised to do so again. Do not take more than prescribed Pain, Sleep and Anxiety Medications. You were cared for by a hospitalist during your hospital stay. If you have any questions about your discharge medications or the care you received while you were in the hospital after you are discharged, you can call the unit and ask to speak with the hospitalist on call if the hospitalist that took care of you is not available.  Once you are discharged, your primary care physician will handle any further medical issues. Please note that NO REFILLS for any discharge medications will be authorized once you are discharged, as it is imperative that you return to your primary care physician (or establish a relationship with a primary care physician if you do not have one) for your aftercare needs so that they can reassess your need for medications and monitor your lab values. You Must read complete instructions/literature along with all the  possible adverse reactions/side effects for all the Medicines you take and that have been prescribed to you. Take any new Medicines after you have completely understood and accept all the possible adverse reactions/side effects. Wear Seat belts while driving. If you have smoked or chewed Tobacco in the last 2 yrs please stop smoking and/or stop any Recreational drug use.   Increase activity slowly   Complete by:  As directed      Discharge Exam: Filed Weights   01/12/18 0315 01/12/18 0954  Weight: 73 kg (161 lb) 72.6 kg (160 lb)   Vitals:   01/14/18 0630 01/14/18 1450  BP: 123/74 112/82  Pulse: 68 68  Resp: 18 16  Temp: 98.1 F (36.7 C)   SpO2: 97% 97%   General: Appear in no distress, no Rash; Oral Mucosa moist Cardiovascular: S1 and S2 Present, no Murmur, no JVD Respiratory: Bilateral Air entry present and Clear to Auscultation, no Crackles, no wheezes Abdomen: Bowel Sound present, Soft and mild tenderness Extremities: no Pedal edema, no calf tenderness Neurology: Grossly no focal neuro deficit.no  The results of significant diagnostics from this hospitalization (including imaging, microbiology, ancillary and laboratory) are listed below for reference.    Significant Diagnostic  Studies: Dg Cholangiogram Operative  Result Date: 01/13/2018 CLINICAL DATA:  Intraoperative cholangiogram during laparoscopic cholecystectomy. EXAM: INTRAOPERATIVE CHOLANGIOGRAM FLUOROSCOPY TIME:  11 seconds (2.31 mGy) COMPARISON:  MRCP - 01/12/2018 FINDINGS: Intraoperative cholangiographic images of the right upper abdominal quadrant during laparoscopic cholecystectomy are provided for review. Surgical clips overlie the expected location of the gallbladder fossa. Contrast injection demonstrates selective cannulation of the central aspect of the cystic duct. There is passage of contrast through the central aspect of the cystic duct with filling of a non dilated common bile duct. There is passage of contrast  though the CBD and into the descending portion of the duodenum. There is minimal reflux of injected contrast into the common hepatic duct and central aspect of the non dilated intrahepatic biliary system. There is minimal opacification of the central aspect the pancreatic duct which appears nondilated. There are no discrete filling defects within the opacified portions of the biliary system to suggest the presence of choledocholithiasis. IMPRESSION: No evidence of choledocholithiasis. Electronically Signed   By: Simonne ComeJohn  Watts M.D.   On: 01/13/2018 10:25   Mr 3d Recon At Scanner  Result Date: 01/12/2018 CLINICAL DATA:  Right upper quadrant abdominal pain. Cholelithiasis at sonography. EXAM: MRI ABDOMEN WITHOUT AND WITH CONTRAST (INCLUDING MRCP) TECHNIQUE: Multiplanar multisequence MR imaging of the abdomen was performed both before and after the administration of intravenous contrast. Heavily T2-weighted images of the biliary and pancreatic ducts were obtained, and three-dimensional MRCP images were rendered by post processing. CONTRAST:  15mL MULTIHANCE GADOBENATE DIMEGLUMINE 529 MG/ML IV SOLN COMPARISON:  01/12/2018 abdominal sonogram. FINDINGS: Significantly motion degraded scan particularly on the postcontrast images, limiting assessment. Lower chest: No acute abnormality at the lung bases. Hepatobiliary: Normal liver size and configuration. No significant hepatic steatosis. No liver mass. Multiple subcentimeter gallstones layering in the gallbladder. No significant gallbladder distention. No definite gallbladder wall thickening. No pericholecystic fluid. No biliary ductal dilatation. Common bile duct diameter 6 mm. No evidence of choledocholithiasis. No biliary strictures or masses. Pancreas: No pancreatic mass or duct dilation.  No pancreas divisum. Spleen: Normal size. No mass. Adrenals/Urinary Tract: Normal adrenals. No hydronephrosis. Normal kidneys with no renal mass. Stomach/Bowel: Normal non-distended  stomach. Visualized small and large bowel is normal caliber, with no bowel wall thickening. Vascular/Lymphatic: Normal caliber abdominal aorta. Patent portal, splenic, hepatic and renal veins. No pathologically enlarged lymph nodes in the abdomen. Other: No abdominal ascites or focal fluid collection. Musculoskeletal: No aggressive appearing focal osseous lesions. IMPRESSION: 1. Cholelithiasis.  No MRI findings of acute cholecystitis. 2. No intrahepatic biliary ductal dilatation. CBD diameter 6 mm, top-normal. No choledocholithiasis. Electronically Signed   By: Delbert PhenixJason A Poff M.D.   On: 01/12/2018 09:20   [place] Abdomen Limited  Result Date: 01/03/2018 CLINICAL DATA:  37 year old female with right upper quadrant abdominal pain. EXAM: ULTRASOUND ABDOMEN LIMITED RIGHT UPPER QUADRANT COMPARISON:  None. FINDINGS: Gallbladder: Small gallstones. No gallbladder wall thickening or pericholecystic fluid. Negative sonographic Murphy's sign. Common bile duct: Diameter: 5 mm Liver: There is slight heterogeneity of the liver parenchyma. Portal vein is patent on color Doppler imaging with normal direction of blood flow towards the liver. IMPRESSION: 1. Cholelithiasis without sonographic evidence of acute cholecystitis. 2. Patent main portal vein with hepatopetal flow. Electronically Signed   By: Elgie CollardArash  Radparvar M.D.   On: 01/03/2018 05:11   Mr Abdomen Mrcp Vivien RossettiW Wo Contast  Result Date: 01/12/2018 CLINICAL DATA:  Right upper quadrant abdominal pain. Cholelithiasis at sonography. EXAM: MRI ABDOMEN WITHOUT AND WITH CONTRAST (INCLUDING MRCP)  TECHNIQUE: Multiplanar multisequence MR imaging of the abdomen was performed both before and after the administration of intravenous contrast. Heavily T2-weighted images of the biliary and pancreatic ducts were obtained, and three-dimensional MRCP images were rendered by post processing. CONTRAST:  15mL MULTIHANCE GADOBENATE DIMEGLUMINE 529 MG/ML IV SOLN COMPARISON:  01/12/2018 abdominal sonogram.  FINDINGS: Significantly motion degraded scan particularly on the postcontrast images, limiting assessment. Lower chest: No acute abnormality at the lung bases. Hepatobiliary: Normal liver size and configuration. No significant hepatic steatosis. No liver mass. Multiple subcentimeter gallstones layering in the gallbladder. No significant gallbladder distention. No definite gallbladder wall thickening. No pericholecystic fluid. No biliary ductal dilatation. Common bile duct diameter 6 mm. No evidence of choledocholithiasis. No biliary strictures or masses. Pancreas: No pancreatic mass or duct dilation.  No pancreas divisum. Spleen: Normal size. No mass. Adrenals/Urinary Tract: Normal adrenals. No hydronephrosis. Normal kidneys with no renal mass. Stomach/Bowel: Normal non-distended stomach. Visualized small and large bowel is normal caliber, with no bowel wall thickening. Vascular/Lymphatic: Normal caliber abdominal aorta. Patent portal, splenic, hepatic and renal veins. No pathologically enlarged lymph nodes in the abdomen. Other: No abdominal ascites or focal fluid collection. Musculoskeletal: No aggressive appearing focal osseous lesions. IMPRESSION: 1. Cholelithiasis.  No MRI findings of acute cholecystitis. 2. No intrahepatic biliary ductal dilatation. CBD diameter 6 mm, top-normal. No choledocholithiasis. Electronically Signed   By: Delbert Phenix M.D.   On: 01/12/2018 09:20   US Abdomen Limited Ruq  Result Date: 01/12/2018 CLINICAL DATA:  Right upper quadrant pain. EXAM: ULTRASOUND ABDOMEN LIMITED RIGHT UPPER QUADRANT COMPARISON:  January 03, 2018 FINDINGS: Gallbladder: Cholelithiasis is identified. The largest stone is seen in the neck of the gallbladder measuring 1.1 cm. Sludge is identified as well. No Murphy's sign, pericholecystic fluid, or wall thickening. Common bile duct: Diameter: 5.1 mm Liver: No focal lesion identified. Within normal limits in parenchymal echogenicity. Portal vein is patent on color  Doppler imaging with normal direction of blood flow towards the liver. IMPRESSION: 1. Cholelithiasis and sludge in the gallbladder as above with no wall thickening, pericholecystic fluid, or Murphy's sign. If concern persists, a HIDA scan could further evaluate. Electronically Signed   By: Gerome Sam III M.D   On: 01/12/2018 07:29    Microbiology: No results found for this or any previous visit (from the past 240 hour(s)).   Labs: CBC: Recent Labs  Lab 01/12/18 0530 01/13/18 0417 01/14/18 0744  WBC 13.0* 7.7 18.6*  HGB 15.9* 13.5 14.6  HCT 46.2* 40.9 43.4  MCV 89.5 90.9 90.0  PLT 177 148* 180   Basic Metabolic Panel: Recent Labs  Lab 01/12/18 0530 01/13/18 0417 01/14/18 0744  NA 138 138 139  K 3.8 3.7 3.7  CL 102 109 102  CO2 27 21* 27  GLUCOSE 109* 79 98  BUN 15 6 7   CREATININE 0.87 0.78 0.79  CALCIUM 9.2 8.1* 9.1   Liver Function Tests: Recent Labs  Lab 01/12/18 0530 01/13/18 0417 01/14/18 0744  AST 100* 30 50*  ALT 76* 43 63*  ALKPHOS 81 59 69  BILITOT 1.0 0.9 0.7  PROT 7.2 5.2* 6.6  ALBUMIN 4.0 2.8* 3.4*   Recent Labs  Lab 01/12/18 0530 01/13/18 0417  LIPASE 1,187* 41   Time spent: 35 minutes  Signed:  Lynden Oxford  [facility] 01/14/2018 , 6:32 PM

## 2018-01-21 NOTE — Telephone Encounter (Signed)
Called and spoke with patient regarding Dr Blair HeysWolfe's recommendation to follow up with surgery as the priority. Patient states that she does still want to keep her appointment with Dr Artis FlockWolfe. She states she is still having some pain and does not follow up with the surgeon for another week.

## 2018-01-23 ENCOUNTER — Encounter: Payer: Self-pay | Admitting: Family Medicine

## 2018-01-23 ENCOUNTER — Ambulatory Visit (INDEPENDENT_AMBULATORY_CARE_PROVIDER_SITE_OTHER): Payer: Managed Care, Other (non HMO) | Admitting: Family Medicine

## 2018-01-23 VITALS — BP 122/86 | HR 77 | Temp 97.9°F | Ht 65.0 in | Wt 155.8 lb

## 2018-01-23 DIAGNOSIS — K851 Biliary acute pancreatitis without necrosis or infection: Secondary | ICD-10-CM

## 2018-01-23 MED ORDER — HYDROCODONE-ACETAMINOPHEN 5-325 MG PO TABS: 1.0000 | ORAL_TABLET | Freq: Four times a day (QID) | ORAL | 0 refills | Status: DC | PRN

## 2018-01-23 NOTE — Progress Notes (Signed)
Patient: Kelli AndersonJennifer [place] MRN: [id number] DOB: [birth year] PCP: Orland MustardWolfe, Ruchy Wildrick, MD     Subjective:  Chief Complaint  Patient presents with  . Hospitalization Follow-up    having some discomfort under incision site    HPI: The patient is a 37 y.o. female who presents today for hospital follow up for lap chole secondary to gallstone pancreatitis.  Admit date: 01/12/2018 Discharge date: 01/14/2018  She presented to ED with abdominal pain that was progressively getting worse along with nausea/vomiting. She has known gallstones and was scheduled to see surgery on outpatient basis. In ER she was found to have elevated lipase to 1187 and mildly elevated LFTs and leukocytosis. Ultrasound showed cholelithiasis, but no cholecystitis. GI recommended MRCP which was negative for any CBD stone. General surgery was consulted and she underwent lap chole. Did well postoperatively and lipase returned to normal as well as her liver enzymes. Discharged with tramadol and robaxin.   She has some complaints of pain in her RUQ that comes at night when she is sleeping and intermittently during the day. Hurts worse with straining. First came on when she was in argument with her husband and yelled at him. She felt like something was tearing inside or pulling. No fever/chills. She states the medication has not helped the pain that much, but helps with sleep. No blood in her stool and she is not vomiting. She is not having many bowel movements. Sunday was her first BM since discharge, but she has no stomach pain and doesn't feel bloated. She is eating a normal diet.   surg path on gallbladder wnl.   Review of Systems  Constitutional: Negative for chills and fever.  Respiratory: Negative for shortness of breath.   Cardiovascular: Negative for chest pain.  Gastrointestinal: Positive for abdominal pain. Negative for diarrhea, nausea and vomiting.  Psychiatric/Behavioral: The patient is not nervous/anxious.      Allergies Patient has No Known Allergies.  Past Medical History Patient  has a past medical history of Anxiety, Arthritis, Asthma, Depression, Dysmenorrhea, Fibromyalgia (2016), and Vaginal delivery (1999,2002,2010,2013).  Surgical History Patient  has a past surgical history that includes Tubal ligation; Wisdom tooth extraction (2006); Cervical biopsy w/ loop electrode excision; Colposcopy; and Cholecystectomy (N/A, 01/13/2018).  Family History Pateint's family history includes Arthritis in her mother; Asthma in her mother; Cancer in her mother; Depression in her mother; Drug abuse in her father and mother; Early death in her mother; Heart attack in her father; Heart disease in her father; Kidney disease in her father; Stroke in her father and maternal grandmother.  Social History Patient  reports that she has been smoking cigarettes.  She has been smoking about 1.00 pack per day. She has never used smokeless tobacco. She reports that she drinks about 0.6 - 1.2 oz of alcohol per week. She reports that she does not use drugs.    Objective: Vitals:   01/23/18 1057  BP: 122/86  Pulse: 77  Temp: 97.9 F (36.6 C)  TempSrc: Oral  SpO2: 98%  Weight: 155 lb 12.8 oz (70.7 kg)  Height: 5\' 5"  (1.651 m)    Body mass index is 25.93 kg/m.  Physical Exam  Constitutional: She appears well-developed and well-nourished.  Neck: Normal range of motion. Neck supple.  Abdominal: Soft. Bowel sounds are normal. She exhibits no distension and no mass. There is tenderness (very mild tenderness just laterally adjacent to RUQ incision). There is no rebound and no guarding.  Skin:  All incisions are healing well. No erythema,  drainage or tenderness  Vitals reviewed.      Assessment/plan: 1. Gallstone pancreatitis Doing well post operatively. Pain likely from dissolvable sutures or trauma from surgery. No concerning finding on exam. Will stop the tramadol, only has one pill left, and give her very  short supply of norco. Heating pad/ibuprofen. Do not overdo physically. Has follow up with surgery next week.       Return if symptoms worsen or fail to improve.     Orland Mustard, MD [facility]   01/23/2018

## 2018-01-31 ENCOUNTER — Ambulatory Visit: Payer: Managed Care, Other (non HMO) | Admitting: Internal Medicine

## 2018-02-15 ENCOUNTER — Telehealth: Payer: Self-pay | Admitting: Family Medicine

## 2018-02-15 NOTE — Telephone Encounter (Signed)
See note

## 2018-02-15 NOTE — Telephone Encounter (Signed)
Left voicemail requesting call back. We do not keep samples of Effexor in the office. Patient can go online to goodrx.com to search for the best prices of prescriptions.

## 2018-02-15 NOTE — Telephone Encounter (Signed)
Copied from CRM [phone]#[id number]. Topic: Quick Communication - See Telephone Encounter >> Feb 15, 2018  1:00 PM [name], [name] wrote: CRM for notification. See Telephone encounter for: 02/15/18.  Pt called stating that she is currently prescribed venlafaxine XR (EFFEXOR-XR) 75 MG 24 hr capsule. Pt states that she lost her insurance recently and cannot afford this medication any longer. Pt would like to know if Dr. Artis FlockWolfe has any samples or could provide her with some other options.

## 2018-02-18 NOTE — Telephone Encounter (Signed)
Spoke to pt.  She will check out [email or web address] for options for her Effexor rx.

## 2018-10-11 ENCOUNTER — Encounter (HOSPITAL_COMMUNITY): Payer: Self-pay | Admitting: Emergency Medicine

## 2018-10-11 ENCOUNTER — Emergency Department (HOSPITAL_COMMUNITY)
Admission: EM | Admit: 2018-10-11 | Discharge: 2018-10-11 | Disposition: A | Discharge status: Home / Self Care | Payer: Self-pay | Attending: Emergency Medicine | Admitting: Emergency Medicine

## 2018-10-11 ENCOUNTER — Other Ambulatory Visit: Payer: Self-pay

## 2018-10-11 ENCOUNTER — Emergency Department (HOSPITAL_COMMUNITY): Payer: Self-pay

## 2018-10-11 ENCOUNTER — Ambulatory Visit: Payer: Self-pay

## 2018-10-11 DIAGNOSIS — J45909 Unspecified asthma, uncomplicated: Secondary | ICD-10-CM | POA: Insufficient documentation

## 2018-10-11 DIAGNOSIS — F17210 Nicotine dependence, cigarettes, uncomplicated: Secondary | ICD-10-CM | POA: Insufficient documentation

## 2018-10-11 DIAGNOSIS — R509 Fever, unspecified: Secondary | ICD-10-CM | POA: Insufficient documentation

## 2018-10-11 DIAGNOSIS — R6889 Other general symptoms and signs: Secondary | ICD-10-CM

## 2018-10-11 DIAGNOSIS — Z79899 Other long term (current) drug therapy: Secondary | ICD-10-CM | POA: Insufficient documentation

## 2018-10-11 DIAGNOSIS — Z20822 Contact with and (suspected) exposure to covid-19: Secondary | ICD-10-CM

## 2018-10-11 DIAGNOSIS — R05 Cough: Secondary | ICD-10-CM | POA: Insufficient documentation

## 2018-10-11 NOTE — Telephone Encounter (Signed)
Patient called and says for the past 3 days she's had a sore throat, a cough for 2 days and progressively worse today with SOB. She says last night she had a temp 101 and this morning. 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 I asked is anything coming up when she coughs, she says no, it's a tight, congested cough. She says her chest feels tight and she's been using inhalers with no relief. I advised to go to the ED for evaluation and for only one person to go with her, care advice given, patient verbalized understanding.   Reason for Disposition . Difficulty breathing  Answer Assessment - Initial Assessment Questions 1. ONSET: "When did the cough begin?"      2 days ago 2. SEVERITY: "How bad is the cough today?"      Worse than yesterday 3. RESPIRATORY DISTRESS: "Describe your breathing."      SOB at rest and moving around, wheezing 4. FEVER: "Do you have a fever?" If so, ask: "What is your temperature, how was it measured, and when did it start?"     Yes 101 5. HEMOPTYSIS: "Are you coughing up any blood?" If so ask: "How much?" (flecks, streaks, tablespoons, etc.)     No 6. TREATMENT: "What have you done so far to treat the cough?" (e.g., meds, fluids, humidifier)     Inhaler 7. CARDIAC HISTORY: "Do you have any history of heart disease?" (e.g., heart attack, congestive heart failure)      No 8. LUNG HISTORY: "Do you have any history of lung disease?"  (e.g., pulmonary embolus, asthma, emphysema)     Asthma 9. PE RISK FACTORS: "Do you have a history of blood clots?" (or: recent major surgery, recent prolonged travel, bedridden)     No 10. OTHER SYMPTOMS: "Do you have any other symptoms? (e.g., runny nose, wheezing, chest pain)     Runny nose (clear), wheezing, chest pain last night 11. PREGNANCY: "Is there any chance you are pregnant?" "When was your last menstrual period?"  No 12. TRAVEL: "Have you traveled out of the country in the last month?" (e.g., travel history, exposures)       No  Protocols used: COUGH - ACUTE NON-PRODUCTIVE-A-AH

## 2018-10-11 NOTE — Discharge Instructions (Signed)
Coronavirus (COVID-19) Are you at risk? ° °Are you at risk for the Coronavirus (COVID-19)? ° °To be considered HIGH RISK for Coronavirus (COVID-19), you have to meet the following criteria: ° °Traveled to [place], [place], [place], [place] or [place]; or in the [place] to [place], [place], [place], or [place]; and have fever, cough, and shortness of breath within the last 2 weeks of travel OR °Been in close contact with a person diagnosed with COVID-19 within the last 2 weeks and have fever, cough, and shortness of breath °IF YOU DO NOT MEET THESE CRITERIA, YOU ARE CONSIDERED LOW RISK FOR COVID-19. ° °What to do if you are HIGH RISK for COVID-19? ° °If you are having a medical emergency, call 911. °Seek medical care right away. Before you go to a doctor’s office, urgent care or emergency department, call ahead and tell them about your recent travel, contact with someone diagnosed with COVID-19, and your symptoms. You should receive instructions from your physician’s office regarding next steps of care.  °When you arrive at healthcare provider, tell the healthcare staff immediately you have returned from visiting [place], [place], [place], [place] or [place]; or traveled in the [place] to [place], [place], [place], or [place]; in the last two weeks or you have been in close contact with a person diagnosed with COVID-19 in the last 2 weeks.   °Tell the health care staff about your symptoms: fever, cough and shortness of breath. °After you have been seen by a medical provider, you will be either: °Tested for (COVID-19) and discharged home on quarantine except to seek medical care if symptoms worsen, and asked to  °Stay home and avoid contact with others until you get your results (4-5 days)  °Avoid travel on public transportation if possible (such as bus, train, or airplane) or °Sent to the Emergency Department by EMS for evaluation, COVID-19 testing, and possible admission depending on your  condition and test results. ° °What to do if you are LOW RISK for COVID-19? ° °Reduce your risk of any infection by using the same precautions used for avoiding the common cold or flu:  °Wash your hands often with soap and warm water for at least 20 seconds.  If soap and water are not readily available, use an alcohol-based hand sanitizer with at least 60% alcohol.  °If coughing or sneezing, cover your mouth and nose by coughing or sneezing into the elbow areas of your shirt or coat, into a tissue or into your sleeve (not your hands). °Avoid shaking hands with others and consider head nods or verbal greetings only. °Avoid touching your eyes, nose, or mouth with unwashed hands.  °Avoid close contact with people who are sick. °Avoid places or events with large numbers of people in one location, like concerts or sporting events. °Carefully consider travel plans you have or are making. °If you are planning any travel outside or inside the [place], visit the CDC’s Travelers’ Health webpage for the latest health notices. °If you have some symptoms but not all symptoms, continue to monitor at home and seek medical attention if your symptoms worsen. °If you are having a medical emergency, call 911. ° ° °ADDITIONAL HEALTHCARE OPTIONS FOR PATIENTS ° °[facility] / e-Visit: [email or web address] °        °[facility]: [phone] ° °[facility]: [phone]          °         °[facility]: [phone]   °

## 2018-10-11 NOTE — Telephone Encounter (Signed)
See note

## 2018-10-11 NOTE — ED Provider Notes (Signed)
[facility] Provider Note   CSN: [id number] Arrival date & time: 10/11/18  1800    History   Chief Complaint Chief Complaint  Patient presents with  . Cough  . Fever    HPI Kelli AndersonJennifer Washington is a 38 y.o. female.     The history is provided by the patient. No language interpreter was used.  Cough  Associated symptoms: fever   Fever  Associated symptoms: cough      38 year old female with history of fibromyalgia, asthma, anxiety, presenting with flulike symptoms.  Patient report for the past 3 days Kelli Washington has had subjective chills, generalized body aches, sore throat, nonproductive cough, subjective fever and feeling fatigued.  Kelli Washington has tried over-the-counter medication with minimal improvement.  Kelli Washington also endorsed having some shortness of breath.  Kelli Washington works at [employer] exposed to many people.  Kelli Washington denies recalling any specific sick contact.  Patient did voice concern for potential COVID-19.  No recent travel.  Does have history of asthma but denies any increased wheezing.  No loss of taste or smell, no nausea vomiting or diarrhea.  Past Medical History:  Diagnosis Date  . Anxiety   . Arthritis   . Asthma   . Depression   . Dysmenorrhea   . Fibromyalgia 2016  . Vaginal delivery 1999,2002,2010,2013    Patient Active Problem List   Diagnosis Date Noted  . Acute gallstone pancreatitis 01/12/2018  . Leukocytosis 01/12/2018  . Elevated LFTs 01/12/2018  . Fibromyalgia 01/03/2018    Past Surgical History:  Procedure Laterality Date  . CERVICAL BIOPSY  W/ LOOP ELECTRODE EXCISION    . CHOLECYSTECTOMY N/A 01/13/2018   Procedure: LAPAROSCOPIC CHOLECYSTECTOMY WITH INTRAOPERATIVE CHOLANGIOGRAM;  Surgeon: Berna Bueonnor, Chelsea A, MD;  Location: [facility];  Service: General;  Laterality: N/A;  . COLPOSCOPY    . TUBAL LIGATION    . WISDOM TOOTH EXTRACTION  2006     OB History    Gravida  4   Para  4   Term  4   Preterm      AB      Living  4      SAB      TAB      Ectopic      Multiple      Live Births  4            Home Medications    Prior to Admission medications   Medication Sig Start Date End Date Taking? Authorizing Provider  HYDROcodone-acetaminophen (NORCO) 5-325 MG tablet Take 1 tablet by mouth every 6 (six) hours as needed for moderate pain. 01/23/18   Orland MustardWolfe, Allison, MD  methocarbamol (ROBAXIN) 500 MG tablet Take 1 tablet (500 mg total) by mouth every 8 (eight) hours as needed for muscle spasms. 01/14/18   Rolly SalterPatel, Pranav M, MD  ondansetron (ZOFRAN ODT) 4 MG disintegrating tablet Take 1 tablet (4 mg total) by mouth every 8 (eight) hours as needed for nausea or vomiting. Patient not taking: Reported on 01/12/2018 01/03/18   Roxy HorsemanBrowning, Robert, PA-C  traMADol (ULTRAM) 50 MG tablet Take 1 tablet (50 mg total) by mouth every 6 (six) hours as needed for moderate pain. 01/14/18   Rolly SalterPatel, Pranav M, MD  venlafaxine XR (EFFEXOR-XR) 75 MG 24 hr capsule Take 1 capsule (75 mg total) by mouth daily with breakfast. 01/03/18   Orland MustardWolfe, Allison, MD    Family History Family History  Problem Relation Age of Onset  . Arthritis Mother   . Asthma  Mother   . Cancer Mother        liver  . Depression Mother   . Drug abuse Mother   . Early death Mother   . Drug abuse Father   . Heart attack Father   . Heart disease Father   . Kidney disease Father   . Stroke Father   . Stroke Maternal Grandmother     Social History Social History   Tobacco Use  . Smoking status: Current Every Day Smoker    Packs/day: 1.00    Types: Cigarettes  . Smokeless tobacco: Never Used  Substance Use Topics  . Alcohol use: Yes    Alcohol/week: 1.0 - 2.0 standard drinks    Types: 1 - 2 Standard drinks or equivalent per week  . Drug use: Never     Allergies   Patient has no known allergies.   Review of Systems Review of Systems  Constitutional: Positive for fever.  Respiratory: Positive for cough.   All other systems reviewed and are negative.     Physical Exam Updated Vital Signs BP 121/66 (BP Location: Right Arm)   Pulse 81   Temp 98.3 F (36.8 C) (Oral)   Resp 17   SpO2 98%   Physical Exam Vitals signs and nursing note reviewed.  Constitutional:      General: Kelli Washington is not in acute distress.    Appearance: Kelli Washington is well-developed.  HENT:     Head: Atraumatic.     Right Ear: Tympanic membrane normal.     Left Ear: Tympanic membrane normal.     Mouth/Throat:     Mouth: Mucous membranes are moist.  Eyes:     Conjunctiva/sclera: Conjunctivae normal.  Neck:     Musculoskeletal: Neck supple.  Cardiovascular:     Rate and Rhythm: Normal rate and regular rhythm.     Pulses: Normal pulses.     Heart sounds: Normal heart sounds.  Pulmonary:     Effort: Pulmonary effort is normal.     Breath sounds: Normal breath sounds. No wheezing, rhonchi or rales.  Abdominal:     Palpations: Abdomen is soft.  Skin:    Findings: No rash.  Neurological:     Mental Status: Kelli Washington is alert and oriented to person, place, and time.      ED Treatments / Results  Labs (all labs ordered are listed, but only abnormal results are displayed) Labs Reviewed - No data to display  EKG None  Radiology Dg Chest Portable 1 View  Result Date: 10/11/2018 CLINICAL DATA:  38 year old female with cough. EXAM: PORTABLE CHEST 1 VIEW COMPARISON:  None. FINDINGS: The heart size and mediastinal contours are within normal limits. Both lungs are clear. The visualized skeletal structures are unremarkable. IMPRESSION: No active disease. Electronically Signed   By: Elgie Collard M.D.   On: 10/11/2018 19:31    Procedures Procedures (including critical care time)  Medications Ordered in ED Medications - No data to display   Initial Impression / Assessment and Plan / ED Course  I have reviewed the triage vital signs and the nursing notes.  Pertinent labs & imaging results that were available during my care of the patient were reviewed by me and  considered in my medical decision making (see chart for details).        BP 121/66 (BP Location: Right Arm)   Pulse 81   Temp 98.3 F (36.8 C) (Oral)   Resp 17   SpO2 98%    Final Clinical  Impressions(s) / ED Diagnoses   Final diagnoses:  Suspected Covid-19 Virus Infection    ED Discharge Orders    None     Kelli Washington was evaluated in Emergency Department on 10/11/2018 for the symptoms described in the history of present illness. Kelli Washington was evaluated in the context of the global COVID-19 pandemic, which necessitated consideration that the patient might be at risk for infection with the SARS-CoV-2 virus that causes COVID-19. Institutional protocols and algorithms that pertain to the evaluation of patients at risk for COVID-19 are in a state of rapid change based on information released by regulatory bodies including the CDC and federal and state organizations. These policies and algorithms were followed during the patient's care in the ED.     Fayrene Helper, PA-C 10/11/18 2000    Melene Plan, DO 10/11/18 2221

## 2018-10-11 NOTE — ED Triage Notes (Signed)
Pt arrives with c/o of non productive cough with generalized body aches and fevers.

## 2018-10-27 IMAGING — MR MR 3D RECON AT SCANNER
20 of 22 series · 20 of 22 positions shown · IV contrast (multihance)
Comparison: 01/12/2018 abdominal sonogram.

CLINICAL DATA: Right upper quadrant abdominal pain. Cholelithiasis
at sonography.

EXAM:
MRI ABDOMEN WITHOUT AND WITH CONTRAST (INCLUDING MRCP)
TECHNIQUE: Multiplanar multisequence MR imaging of the abdomen was performed
both before and after the administration of intravenous contrast.
Heavily T2-weighted images of the biliary and pancreatic ducts were
obtained, and three-dimensional MRCP images were rendered by post
processing.
CONTRAST:  15mL MULTIHANCE GADOBENATE DIMEGLUMINE 529 MG/ML IV SOLN

[Series 3: DWI b500 · axial · 6.0mm · 1.76mm/px · 1 of 78 slices shown]
[im 1/78]
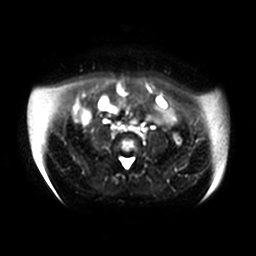

[Series 4: T2 fat-sat · axial · 5.0mm · 0.86mm/px · 1 of 54 slices shown]
[im 1/54]
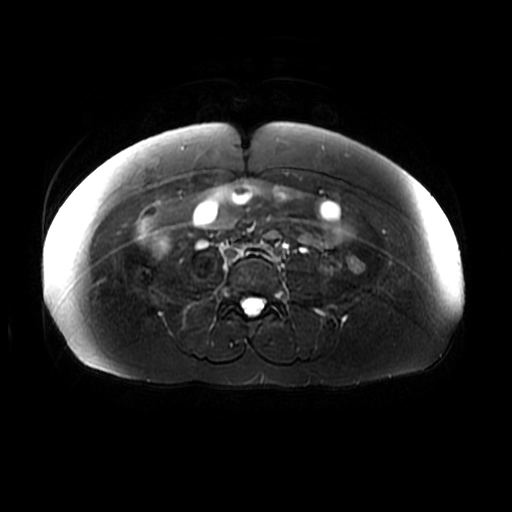

[Series 5: MRCP · coronal · 1.6mm · 0.62mm/px · 1 of 137 slices shown (1 of 3)]
[im 1/137]
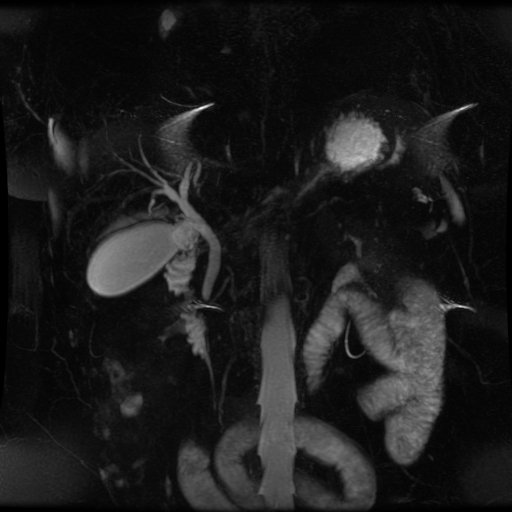

[Series 6: MRCP · coronal · 2.0mm · 0.70mm/px · 1 of 55 slices shown (2 of 3)]
[im 1/55]
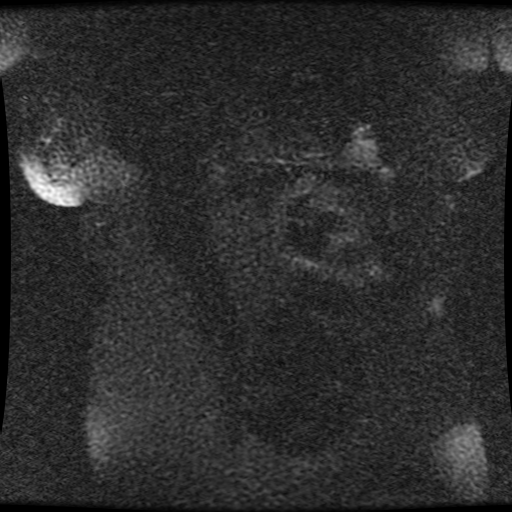

[Series 7: bSSFP fat-sat · coronal · 5.0mm · 0.78mm/px · 1 of 43 slices shown]
[im 1/43]
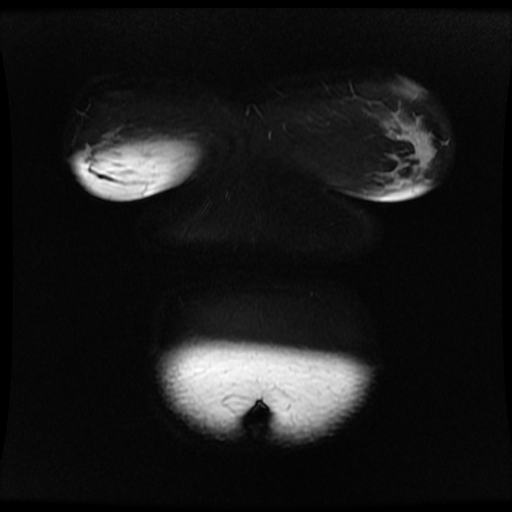

[Series 9: ax dualecho · axial · 5.0mm · 0.86mm/px · 1 of 108 slices shown]
[im 1/108]
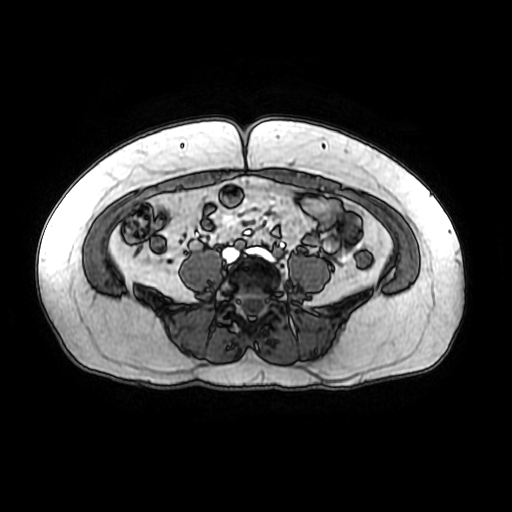

[Series 10: MRCP · coronal · 40.0mm · 0.70mm/px · 1 of 9 slices shown (3 of 3)]
[im 1/9]
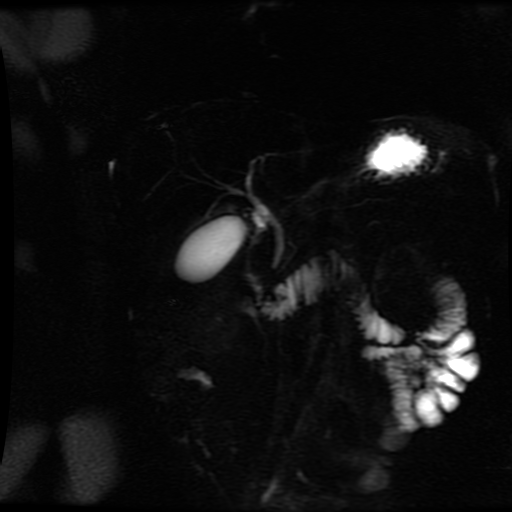

[Series 12: T1 dynamic · coronal · delayed · 4.0mm · 0.78mm/px · 1 of 102 slices shown (1 of 6)]
[im 1/102]
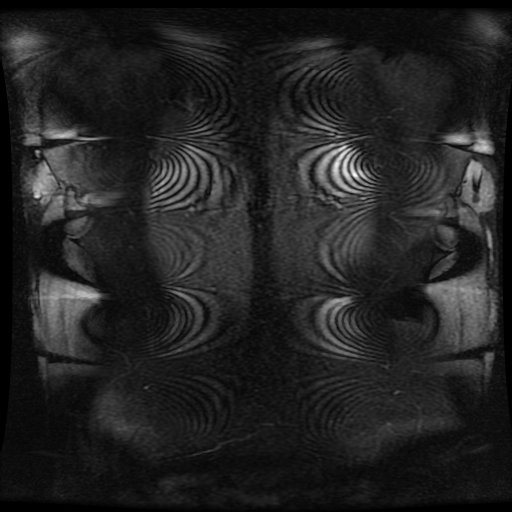

[Series 300: DWI · axial · 6.0mm · 1.76mm/px · 1 of 39 slices shown]
[im 1/39]
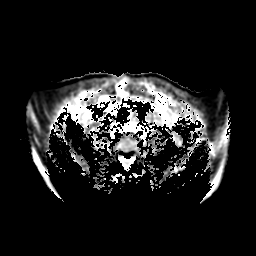

[Series 500: reformatted · axial · 1.6mm · 0.62mm/px · 1 of 117 slices shown]
[im 1/117]
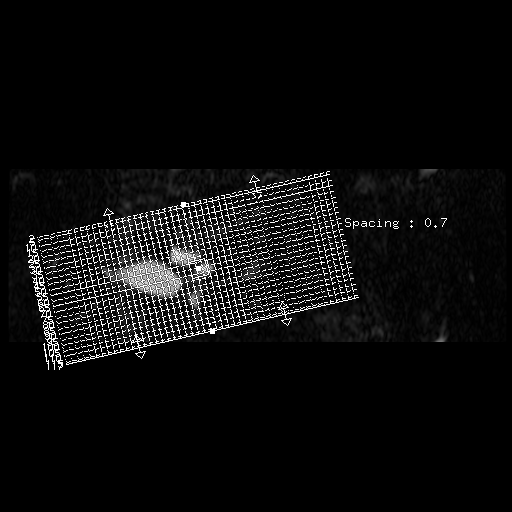

[Series 1100: T1 dynamic · axial · 5.0mm · 0.78mm/px · 1 of 100 slices shown (2 of 6)]
[im 1/100]
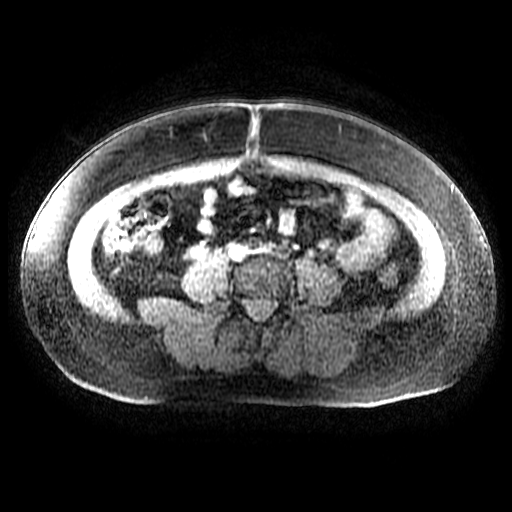

[Series 1101: T1 dynamic · axial · 5.0mm · 0.78mm/px · 1 of 100 slices shown (3 of 6)]
[im 1/100]
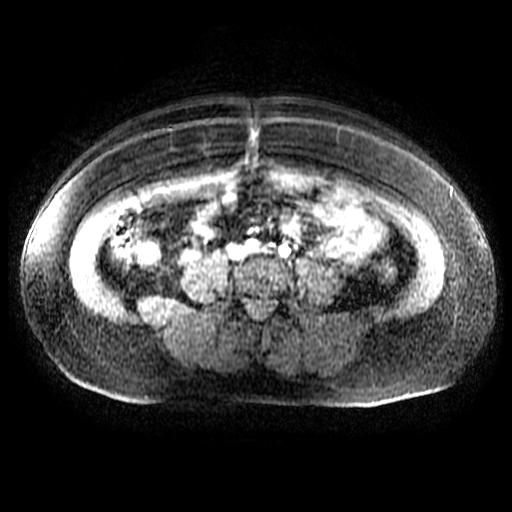

[Series 1102: T1 dynamic · axial · 5.0mm · 0.78mm/px · 1 of 100 slices shown (4 of 6)]
[im 1/100]
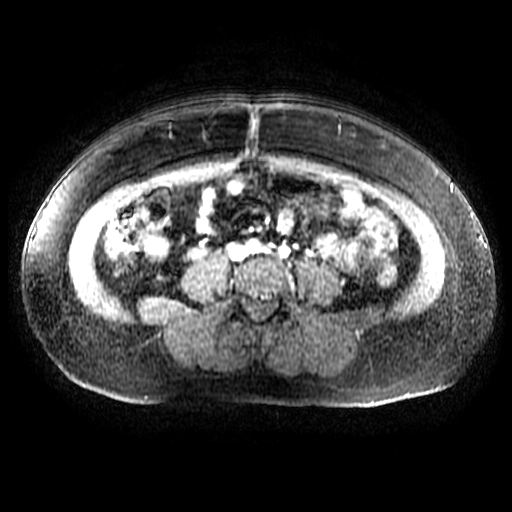

[Series 1103: T1 dynamic · axial · 5.0mm · 0.78mm/px · 1 of 100 slices shown (5 of 6)]
[im 1/100]
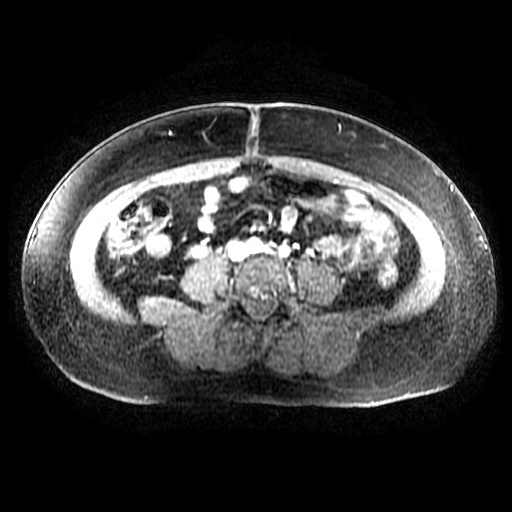

[Series 1104: T1 dynamic · axial · 5.0mm · 0.78mm/px · 1 of 100 slices shown (6 of 6)]
[im 1/100]
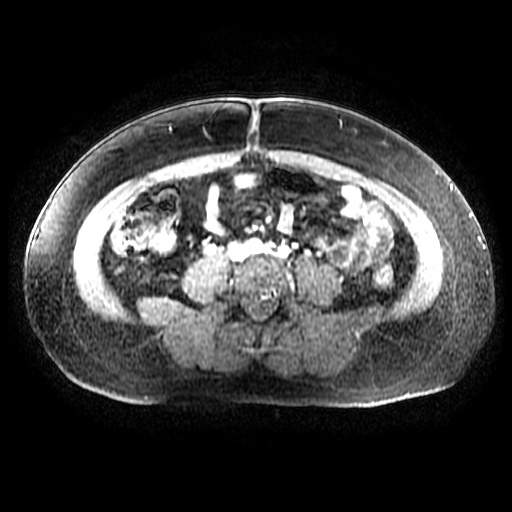

[((id)/(id)/1)-((id)/(id)/1) · axial · 5.0mm · 0.78mm/px · 1 of 100 slices shown (1 of 5)]
[im 1/100]
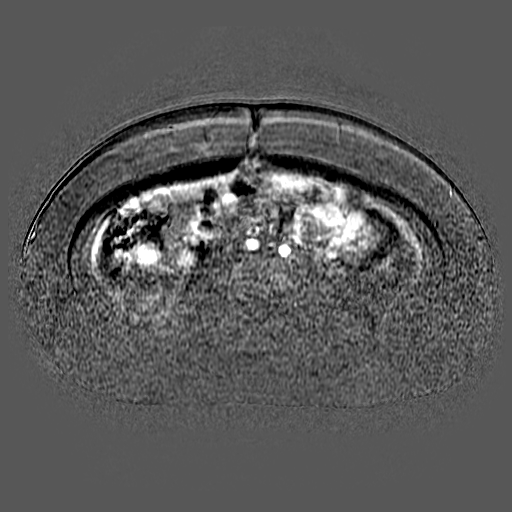

[((id)/(id)/1)-((id)/(id)/1) · axial · 5.0mm · 0.78mm/px · 1 of 100 slices shown (2 of 5)]
[im 1/100]
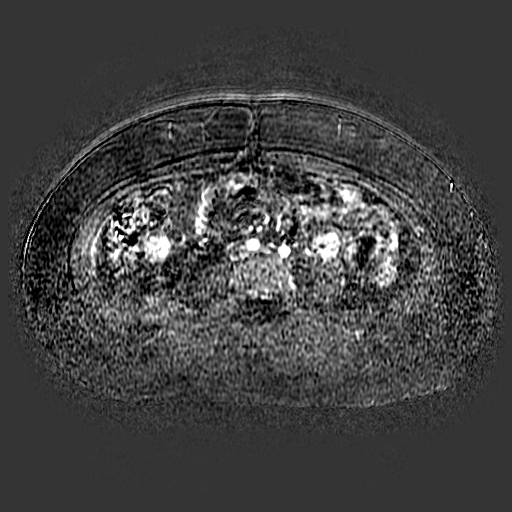

[((id)/(id)/1)-((id)/(id)/1) · axial · 5.0mm · 0.78mm/px · 1 of 100 slices shown (3 of 5)]
[im 1/100]
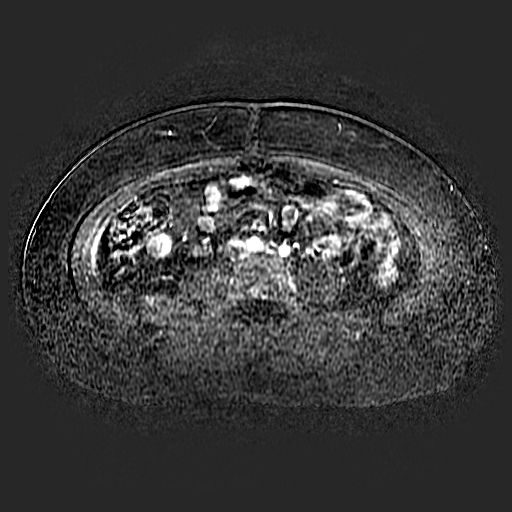

[((id)/(id)/1)-((id)/(id)/1) · axial · 5.0mm · 0.78mm/px · 1 of 100 slices shown (4 of 5)]
[im 1/100]
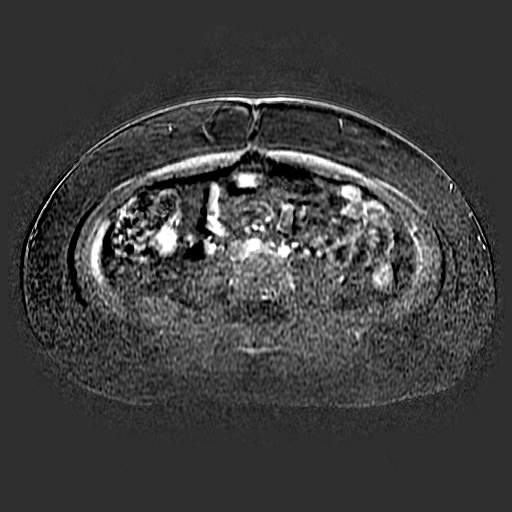

[((id)/(id)/1)-((id)/(id)/1) · axial · 5.0mm · 0.78mm/px · 1 of 100 slices shown (5 of 5)]
[im 1/100]
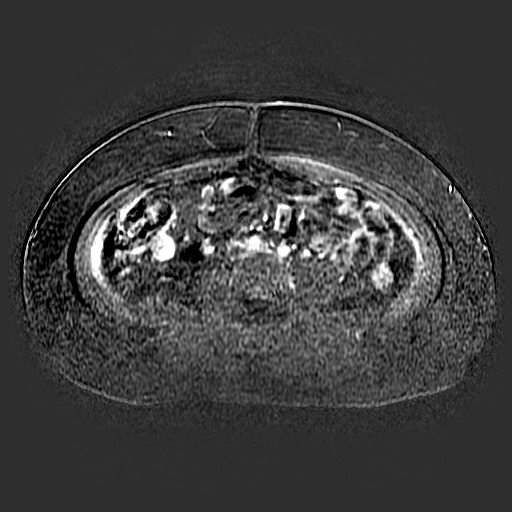

[20 of 22 positions shown; findings below may reference images not displayed]

FINDINGS: Significantly motion degraded scan particularly on the postcontrast
images, limiting assessment.

Lower chest: No acute abnormality at the lung bases.

Hepatobiliary: Normal liver size and configuration. No significant
hepatic steatosis. No liver mass. Multiple subcentimeter gallstones
layering in the gallbladder. No significant gallbladder distention.
No definite gallbladder wall thickening. No pericholecystic fluid.
No biliary ductal dilatation. Common bile duct diameter 6 mm. No
evidence of choledocholithiasis. No biliary strictures or masses.

Pancreas: No pancreatic mass or duct dilation.  No pancreas divisum.

Spleen: Normal size. No mass.

Adrenals/Urinary Tract: Normal adrenals. No hydronephrosis. Normal
kidneys with no renal mass.

Stomach/Bowel: Normal non-distended stomach. Visualized small and
large bowel is normal caliber, with no bowel wall thickening.

Vascular/Lymphatic: Normal caliber abdominal aorta. Patent portal,
splenic, hepatic and renal veins. No pathologically enlarged lymph
nodes in the abdomen.

Other: No abdominal ascites or focal fluid collection.

Musculoskeletal: No aggressive appearing focal osseous lesions.
IMPRESSION: 1. Cholelithiasis.  No MRI findings of acute cholecystitis.
2. No intrahepatic biliary ductal dilatation. CBD diameter 6 mm,
top-normal. No choledocholithiasis.

## 2018-10-27 IMAGING — US US ABDOMEN LIMITED
1 series · 14 of 25 positions shown · non-contrast
Comparison: January 03, 2018

CLINICAL DATA: Right upper quadrant pain.

EXAM:
ULTRASOUND ABDOMEN LIMITED RIGHT UPPER QUADRANT

[Series 1: us abdomen limited · 14 of 54 slices shown]
[im 1/54]
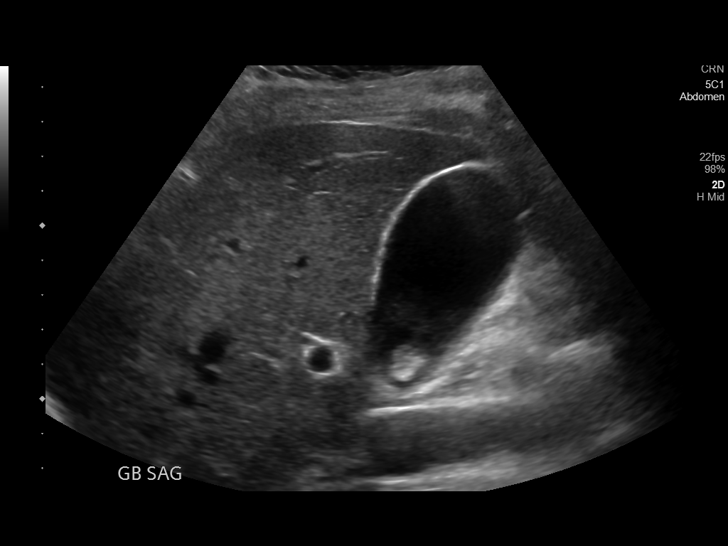
[im 5/54]
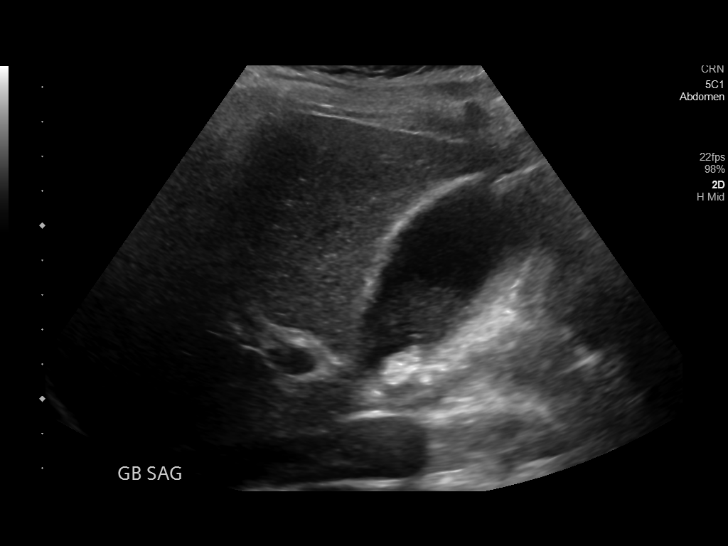
[im 9/54]
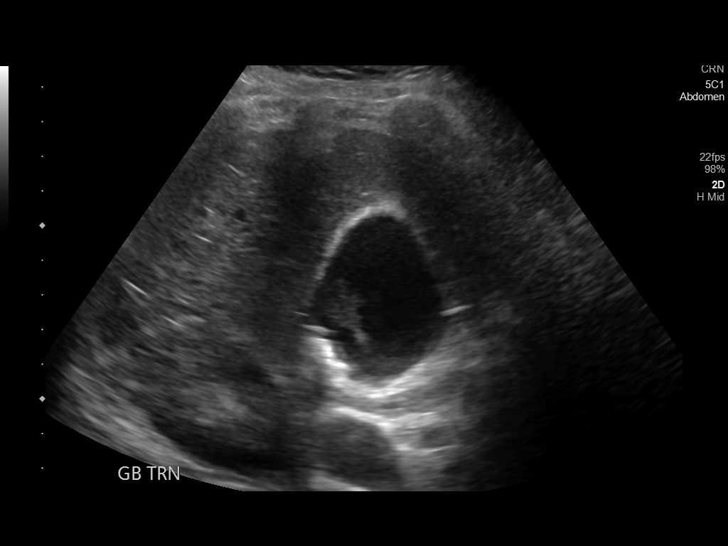
[im 14/54]
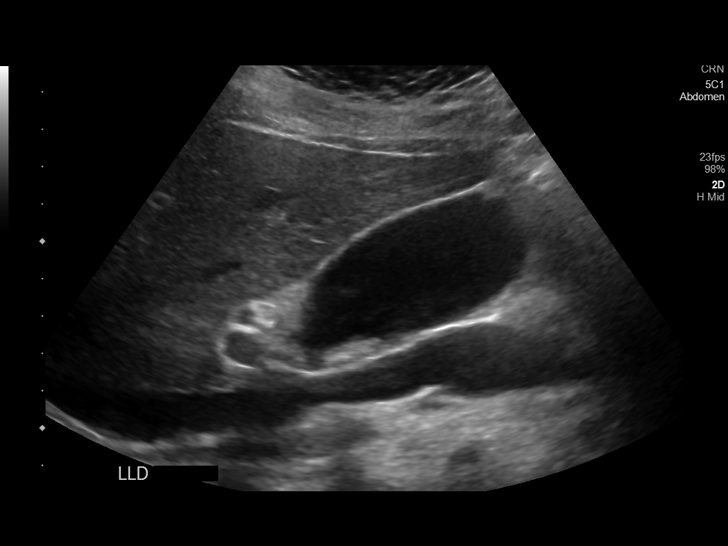
[im 18/54]
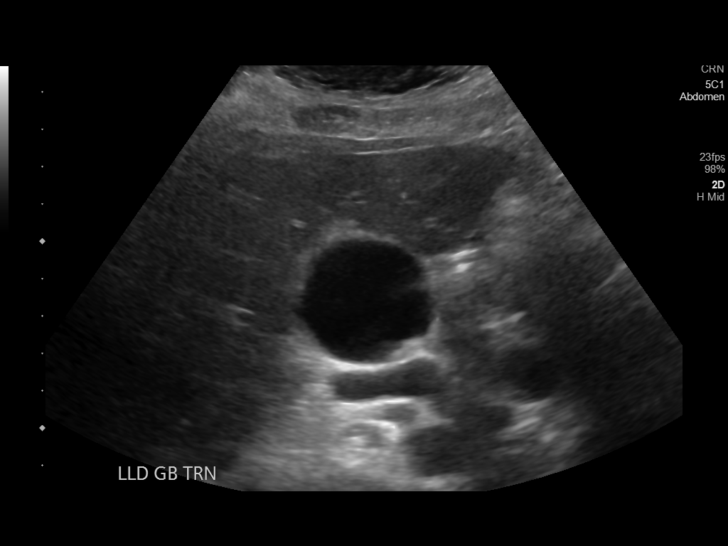
[im 20/54]
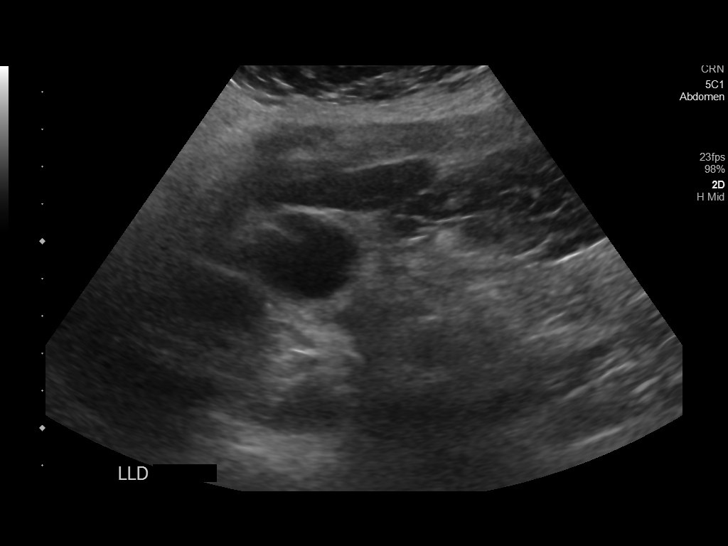
[im 25/54]
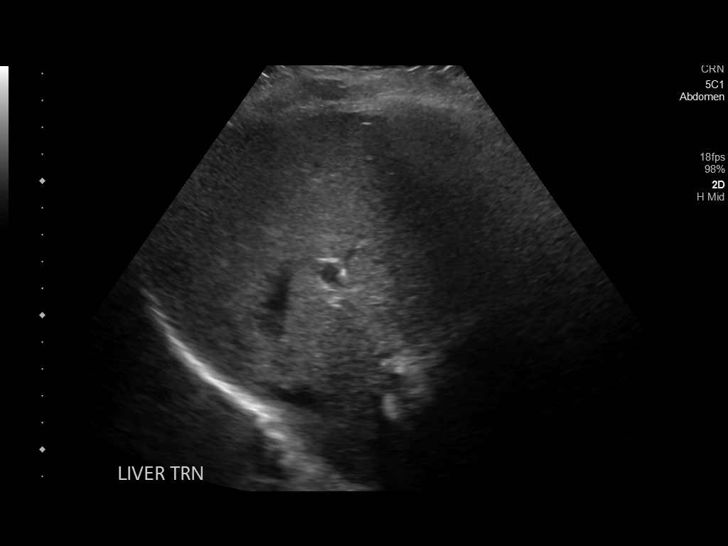
[im 29/54]
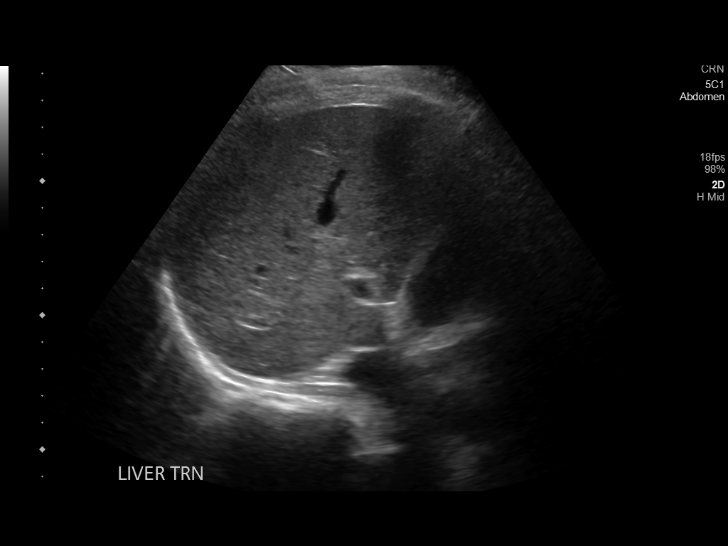
[im 34/54]
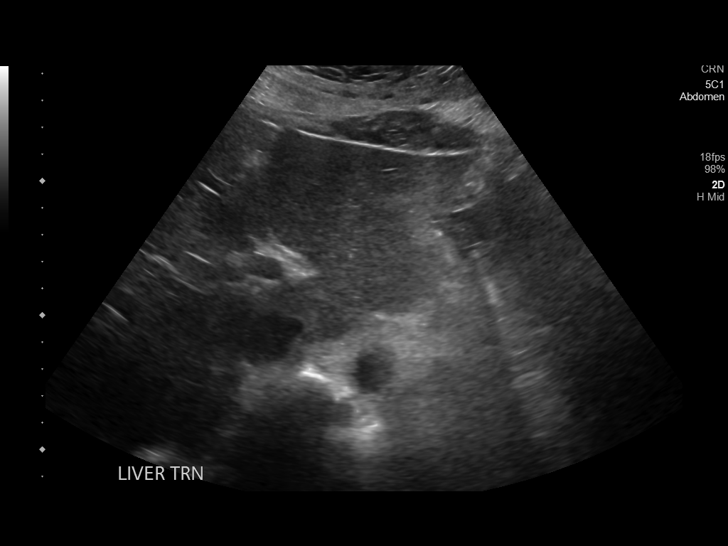
[im 36/54]
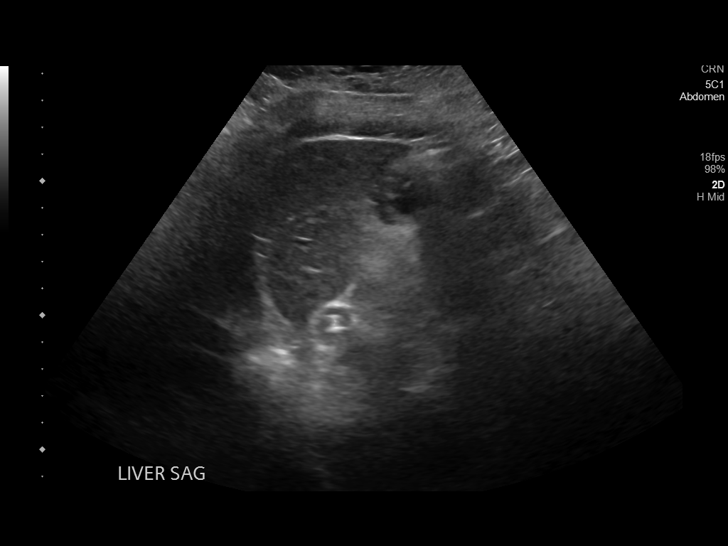
[im 40/54]
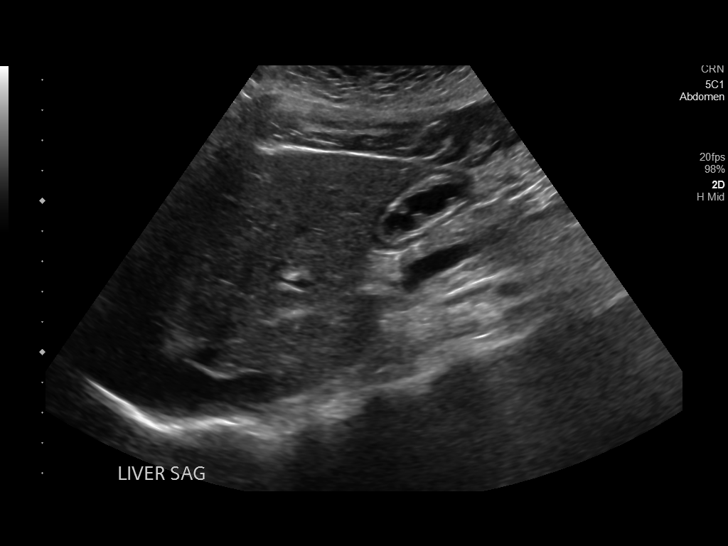
[im 45/54]
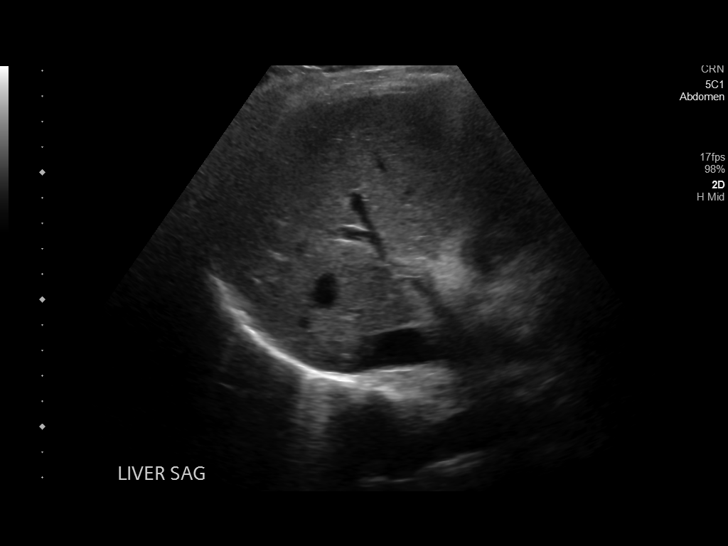
[im 49/54]
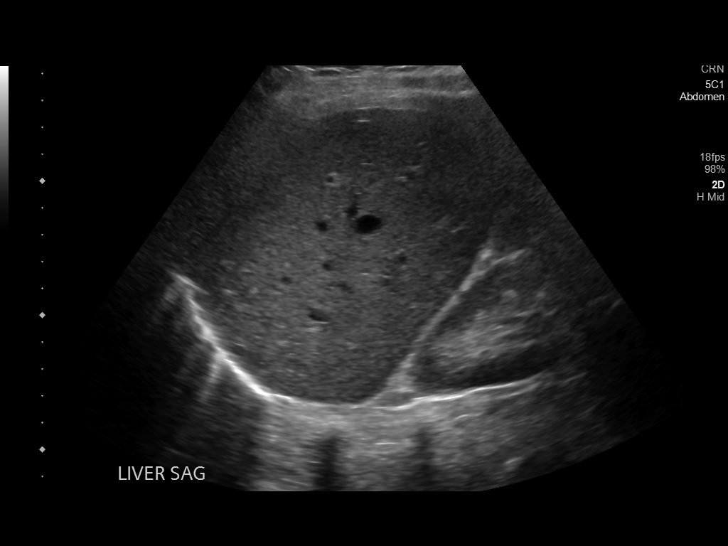
[im 54/54]
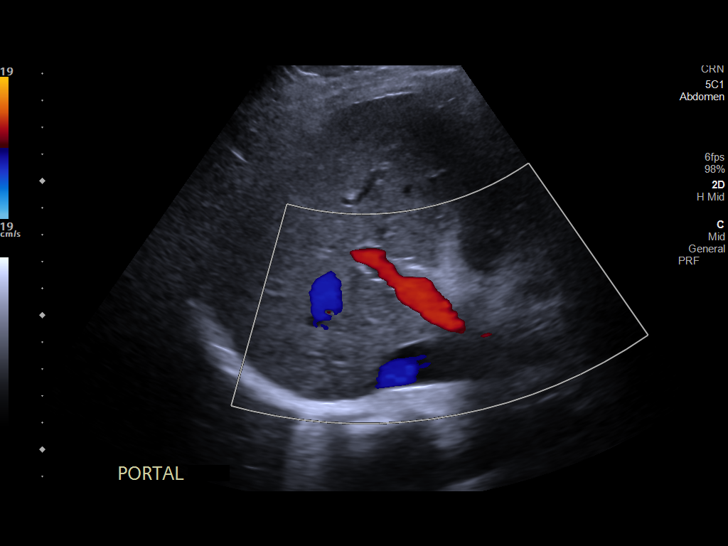

[14 of 25 positions shown; findings below may reference images not displayed]

FINDINGS: Gallbladder:

Cholelithiasis is identified. The largest stone is seen in the neck
of the gallbladder measuring 1.1 cm. Sludge is identified as well.
No Murphy's sign, pericholecystic fluid, or wall thickening.

Common bile duct:

Diameter: 5.1 mm

Liver:

No focal lesion identified. Within normal limits in parenchymal
echogenicity. Portal vein is patent on color Doppler imaging with
normal direction of blood flow towards the liver.
IMPRESSION: 1. Cholelithiasis and sludge in the gallbladder as above with no
wall thickening, pericholecystic fluid, or Murphy's sign. If concern
persists, a HIDA scan could further evaluate.

## 2019-01-28 ENCOUNTER — Emergency Department (HOSPITAL_COMMUNITY)
Admission: EM | Admit: 2019-01-28 | Discharge: 2019-01-28 | Disposition: A | Discharge status: Home / Self Care | Payer: Self-pay | Attending: Emergency Medicine | Admitting: Emergency Medicine

## 2019-01-28 ENCOUNTER — Encounter (HOSPITAL_COMMUNITY): Payer: Self-pay | Admitting: *Deleted

## 2019-01-28 ENCOUNTER — Emergency Department (HOSPITAL_COMMUNITY): Payer: Self-pay

## 2019-01-28 ENCOUNTER — Other Ambulatory Visit: Payer: Self-pay

## 2019-01-28 DIAGNOSIS — J45909 Unspecified asthma, uncomplicated: Secondary | ICD-10-CM | POA: Insufficient documentation

## 2019-01-28 DIAGNOSIS — R0789 Other chest pain: Secondary | ICD-10-CM | POA: Insufficient documentation

## 2019-01-28 DIAGNOSIS — F17210 Nicotine dependence, cigarettes, uncomplicated: Secondary | ICD-10-CM | POA: Insufficient documentation

## 2019-01-28 LAB — CBC
HCT: 45 % (ref 36.0–46.0)
Hemoglobin: 15.4 g/dL — ABNORMAL HIGH (ref 12.0–15.0)
MCH: 30.8 pg (ref 26.0–34.0)
MCHC: 34.2 g/dL (ref 30.0–36.0)
MCV: 90 fL (ref 80.0–100.0)
Platelets: 191 10*3/uL (ref 150–400)
RBC: 5 MIL/uL (ref 3.87–5.11)
RDW: 12.9 % (ref 11.5–15.5)
WBC: 11.5 10*3/uL — ABNORMAL HIGH (ref 4.0–10.5)
nRBC: 0 % (ref 0.0–0.2)

## 2019-01-28 LAB — I-STAT BETA HCG BLOOD, ED (MC, WL, AP ONLY): I-stat hCG, quantitative: <5 m[IU]/mL (ref <5)

## 2019-01-28 LAB — BASIC METABOLIC PANEL
Anion gap: 7 (ref 5–15)
BUN: 14 mg/dL (ref 6–20)
CO2: 28 mmol/L (ref 22–32)
Calcium: 9 mg/dL (ref 8.9–10.3)
Chloride: 102 mmol/L (ref 98–111)
Creatinine, Ser: 0.82 mg/dL (ref 0.44–1.00)
GFR calc Af Amer: >60 mL/min (ref >60)
GFR calc non Af Amer: >60 mL/min (ref >60)
Glucose, Bld: 66 mg/dL — ABNORMAL LOW (ref 70–99)
Potassium: 3.4 mmol/L — ABNORMAL LOW (ref 3.5–5.1)
Sodium: 137 mmol/L (ref 135–145)

## 2019-01-28 LAB — TROPONIN I (HIGH SENSITIVITY): Troponin I (High Sensitivity): 3 ng/L (ref <18)

## 2019-01-28 LAB — CBG MONITORING, ED: Glucose-Capillary: 98 mg/dL (ref 70–99)

## 2019-01-28 MED ORDER — SODIUM CHLORIDE 0.9% FLUSH: 3.0000 mL | Freq: Once | INTRAVENOUS | Status: DC

## 2019-01-28 NOTE — ED Notes (Signed)
Patient verbalizes understanding of discharge instructions. Opportunity for questioning and answers were provided. Armband removed by staff, pt discharged from ED ambulatory.   

## 2019-01-28 NOTE — ED Provider Notes (Signed)
TIME SEEN: 5:24 AM  CHIEF COMPLAINT: Chest pain  HPI: Patient is a 38 year old female with history of asthma, depression, fibromyalgia who presents the emergency department with several weeks worth of intermittent chest pain.  States that she gets intermittent tightness in the center of her chest with pain between her shoulders.  She will sometimes feel lightheaded and feel like there is a lump in her throat.  She has no pain currently.  No shortness of breath, nausea or vomiting, diaphoresis.  No family history of premature CAD.  Denies hypertension, diabetes or hyperlipidemia.  Denies drug or alcohol use.  Patient is a smoker.  No history of PE, DVT, exogenous estrogen use, recent fractures, surgery, trauma, hospitalization or prolonged travel. No lower extremity swelling or pain. No calf tenderness.   ROS: See HPI Constitutional: no fever  Eyes: no drainage  ENT: no runny nose   Cardiovascular:   chest pain  Resp: no SOB  GI: no vomiting GU: no dysuria Integumentary: no rash  Allergy: no hives  Musculoskeletal: no leg swelling  Neurological: no slurred speech ROS otherwise negative  PAST MEDICAL HISTORY/PAST SURGICAL HISTORY:  Past Medical History:  Diagnosis Date  . Anxiety   . Arthritis   . Asthma   . Depression   . Dysmenorrhea   . Fibromyalgia 2016  . Vaginal delivery 1999,2002,2010,2013    MEDICATIONS:  Prior to Admission medications   Medication Sig Start Date End Date Taking? Authorizing Provider  HYDROcodone-acetaminophen (NORCO) 5-325 MG tablet Take 1 tablet by mouth every 6 (six) hours as needed for moderate pain. 01/23/18   Orland MustardWolfe, Allison, MD  methocarbamol (ROBAXIN) 500 MG tablet Take 1 tablet (500 mg total) by mouth every 8 (eight) hours as needed for muscle spasms. 01/14/18   Rolly SalterPatel, Pranav M, MD  ondansetron (ZOFRAN ODT) 4 MG disintegrating tablet Take 1 tablet (4 mg total) by mouth every 8 (eight) hours as needed for nausea or vomiting. Patient not taking:  Reported on 01/12/2018 01/03/18   Roxy HorsemanBrowning, Robert, PA-C  traMADol (ULTRAM) 50 MG tablet Take 1 tablet (50 mg total) by mouth every 6 (six) hours as needed for moderate pain. 01/14/18   Rolly SalterPatel, Pranav M, MD  venlafaxine XR (EFFEXOR-XR) 75 MG 24 hr capsule Take 1 capsule (75 mg total) by mouth daily with breakfast. 01/03/18   Orland MustardWolfe, Allison, MD    ALLERGIES:  No Known Allergies  SOCIAL HISTORY:  Social History   Tobacco Use  . Smoking status: Current Every Day Smoker    Packs/day: 1.00    Types: Cigarettes  . Smokeless tobacco: Never Used  Substance Use Topics  . Alcohol use: Yes    Alcohol/week: 1.0 - 2.0 standard drinks    Types: 1 - 2 Standard drinks or equivalent per week    FAMILY HISTORY: Family History  Problem Relation Age of Onset  . Arthritis Mother   . Asthma Mother   . Cancer Mother        liver  . Depression Mother   . Drug abuse Mother   . Early death Mother   . Drug abuse Father   . Heart attack Father   . Heart disease Father   . Kidney disease Father   . Stroke Father   . Stroke Maternal Grandmother     EXAM: BP 112/73 (BP Location: Right Arm)   Pulse 81   Temp 98.2 F (36.8 C)   Resp 16   LMP 01/14/2019   SpO2 97%  CONSTITUTIONAL: Alert and oriented  and responds appropriately to questions. Well-appearing; well-nourished HEAD: Normocephalic EYES: Conjunctivae clear, pupils appear equal, EOMI ENT: normal nose; moist mucous membranes NECK: Supple, no meningismus, no nuchal rigidity, no LAD  CARD: RRR; S1 and S2 appreciated; no murmurs, no clicks, no rubs, no gallops RESP: Normal chest excursion without splinting or tachypnea; breath sounds clear and equal bilaterally; no wheezes, no rhonchi, no rales, no hypoxia or respiratory distress, speaking full sentences ABD/GI: Normal bowel sounds; non-distended; soft, non-tender, no rebound, no guarding, no peritoneal signs, no hepatosplenomegaly BACK:  The back appears normal and is non-tender to palpation,  there is no CVA tenderness EXT: Normal ROM in all joints; non-tender to palpation; no edema; normal capillary refill; no cyanosis, no calf tenderness or swelling    SKIN: Normal color for age and race; warm; no rash NEURO: Moves all extremities equally PSYCH: The patient's mood and manner are appropriate. Grooming and personal hygiene are appropriate.  MEDICAL DECISION MAKING: Patient here with a very atypical chest pain.  She has no risk factors for ACS other than tobacco use and has had constant chest pain for 2 days it is now gone.  She has had 1 negative troponin and I do not feel she needs serial cardiac enzymes today.  Patient is PERC negative.  I do not think this is a dissection.  No sign of volume overload, pneumonia or pneumothorax on chest x-ray.  She does have a PCP for follow-up.  Recommended close follow-up with her primary care doctor.  Discussed return precautions.  She is comfortable with this plan.  EKG shows no ischemic abnormality, arrhythmia or interval changes today.  She is hemodynamically stable.    At this time, I do not feel there is any life-threatening condition present. I have reviewed and discussed all results (EKG, imaging, lab, urine as appropriate) and exam findings with patient/family. I have reviewed nursing notes and appropriate previous records.  I feel the patient is safe to be discharged home without further emergent workup and can continue workup as an outpatient as needed. Discussed usual and customary return precautions. Patient/family verbalize understanding and are comfortable with this plan.  Outpatient follow-up has been provided as needed. All questions have been answered.      EKG Interpretation  Date/Time:  Tuesday January 28 2019 02:02:17 EDT Ventricular Rate:  89 PR Interval:  152 QRS Duration: 90 QT Interval:  346 QTC Calculation: 420 R Axis:   81 Text Interpretation:  Normal sinus rhythm Normal ECG No old tracing to compare Confirmed by Ward,  Cyril Mourning [phone]) on 01/28/2019 5:24:41 AM         Ward, Delice Bison, DO 01/28/19 4944

## 2019-01-28 NOTE — Discharge Instructions (Addendum)
Your EKG, chest x-ray, blood work including cardiac labs appeared normal today.  I recommend close follow-up with your primary care physician if symptoms continue as they may recommend Holter monitoring/ZIO patch to capture episodes and further work-up such as thyroid function panel.

## 2019-01-28 NOTE — ED Triage Notes (Signed)
Pt reports intermittent pain in the center of her chest intermittent for several weeks, constant the last two days. C/o lower back pain, pain in between the shoulders, dizziness.

## 2019-01-30 ENCOUNTER — Emergency Department (HOSPITAL_COMMUNITY): Payer: Self-pay

## 2019-01-30 ENCOUNTER — Other Ambulatory Visit: Payer: Self-pay

## 2019-01-30 ENCOUNTER — Encounter (HOSPITAL_COMMUNITY): Payer: Self-pay

## 2019-01-30 ENCOUNTER — Emergency Department (HOSPITAL_COMMUNITY)
Admission: EM | Admit: 2019-01-30 | Discharge: 2019-01-30 | Disposition: A | Discharge status: Home / Self Care | Payer: Self-pay | Attending: Emergency Medicine | Admitting: Emergency Medicine

## 2019-01-30 DIAGNOSIS — R0789 Other chest pain: Secondary | ICD-10-CM | POA: Insufficient documentation

## 2019-01-30 DIAGNOSIS — F17210 Nicotine dependence, cigarettes, uncomplicated: Secondary | ICD-10-CM | POA: Insufficient documentation

## 2019-01-30 DIAGNOSIS — N390 Urinary tract infection, site not specified: Secondary | ICD-10-CM | POA: Insufficient documentation

## 2019-01-30 LAB — CBC WITH DIFFERENTIAL/PLATELET
Abs Immature Granulocytes: 0.08 10*3/uL — ABNORMAL HIGH (ref 0.00–0.07)
Basophils Absolute: 0.1 10*3/uL (ref 0.0–0.1)
Basophils Relative: 0 %
Eosinophils Absolute: 0.2 10*3/uL (ref 0.0–0.5)
Eosinophils Relative: 2 %
HCT: 43 % (ref 36.0–46.0)
Hemoglobin: 13.9 g/dL (ref 12.0–15.0)
Immature Granulocytes: 1 %
Lymphocytes Relative: 35 %
Lymphs Abs: 4 10*3/uL (ref 0.7–4.0)
MCH: 29.6 pg (ref 26.0–34.0)
MCHC: 32.3 g/dL (ref 30.0–36.0)
MCV: 91.5 fL (ref 80.0–100.0)
Monocytes Absolute: 0.9 10*3/uL (ref 0.1–1.0)
Monocytes Relative: 8 %
Neutro Abs: 6.1 10*3/uL (ref 1.7–7.7)
Neutrophils Relative %: 54 %
Platelets: 178 10*3/uL (ref 150–400)
RBC: 4.7 MIL/uL (ref 3.87–5.11)
RDW: 13.1 % (ref 11.5–15.5)
WBC: 11.3 10*3/uL — ABNORMAL HIGH (ref 4.0–10.5)
nRBC: 0 % (ref 0.0–0.2)

## 2019-01-30 LAB — URINALYSIS, ROUTINE W REFLEX MICROSCOPIC
Bilirubin Urine: NEGATIVE
Glucose, UA: NEGATIVE mg/dL
Hgb urine dipstick: NEGATIVE
Ketones, ur: NEGATIVE mg/dL
Nitrite: POSITIVE — AB
Protein, ur: NEGATIVE mg/dL
Specific Gravity, Urine: 1.025 (ref 1.005–1.030)
pH: 5 (ref 5.0–8.0)

## 2019-01-30 LAB — TROPONIN I (HIGH SENSITIVITY)
Troponin I (High Sensitivity): <2 ng/L (ref <18)
Troponin I (High Sensitivity): <2 ng/L (ref <18)

## 2019-01-30 LAB — BASIC METABOLIC PANEL
Anion gap: 11 (ref 5–15)
BUN: 13 mg/dL (ref 6–20)
CO2: 23 mmol/L (ref 22–32)
Calcium: 8.8 mg/dL — ABNORMAL LOW (ref 8.9–10.3)
Chloride: 106 mmol/L (ref 98–111)
Creatinine, Ser: 0.58 mg/dL (ref 0.44–1.00)
GFR calc Af Amer: >60 mL/min (ref >60)
GFR calc non Af Amer: >60 mL/min (ref >60)
Glucose, Bld: 96 mg/dL (ref 70–99)
Potassium: 3.8 mmol/L (ref 3.5–5.1)
Sodium: 140 mmol/L (ref 135–145)

## 2019-01-30 LAB — POC URINE PREG, ED: Preg Test, Ur: NEGATIVE

## 2019-01-30 MED ORDER — NITROFURANTOIN MONOHYD MACRO 100 MG PO CAPS: 100.0000 mg | ORAL_CAPSULE | Freq: Two times a day (BID) | ORAL | 0 refills | Status: DC

## 2019-01-30 MED ORDER — KETOROLAC TROMETHAMINE 30 MG/ML IJ SOLN
30.0000 mg | Freq: Once | INTRAMUSCULAR | Status: AC
  Administered 2019-01-30: 05:00:00 30 mg via INTRAVENOUS
  Filled 2019-01-30: qty 1

## 2019-01-30 MED ORDER — NITROFURANTOIN MONOHYD MACRO 100 MG PO CAPS
100.0000 mg | ORAL_CAPSULE | Freq: Once | ORAL | Status: AC
  Administered 2019-01-30: 100 mg via ORAL
  Filled 2019-01-30: qty 1

## 2019-01-30 MED ORDER — NAPROXEN 500 MG PO TABS: 500.0000 mg | ORAL_TABLET | Freq: Two times a day (BID) | ORAL | 0 refills | Status: DC

## 2019-01-30 NOTE — ED Provider Notes (Signed)
[facility] DEPT Provider Note   CSN: [id number] Arrival date & time: 01/30/19  0222    History   Chief Complaint Chief Complaint  Patient presents with  . Epigastric Pain  . Shoulder Pain    HPI Kelli Washington is a 38 y.o. female.   The history is provided by the patient.  Shoulder Pain She has history of asthma, depression, fibromyalgia and comes in complaining of chest discomfort and dysuria.  She has been having some vague chest pain for several weeks which has become constant over the last 4-5 days.  Pain is a dull and burning sensation in the midsternal area without radiation.  There is no associated dyspnea, nausea, diaphoresis.  It seems to be worse when she is sitting, it is not related to exertion.  She has difficulty putting a number on the pain.  She thought it might be acid reflux and has taken antacids and pantoprazole with no relief.  She states she does intermittently take ibuprofen but does not use NSAIDs on a regular basis.  She is a cigarette smoker but denies history of hypertension or diabetes or hyperlipidemia and there is no family history of premature coronary atherosclerosis.  A second complaint, she has been having urinary urgency, frequency, tenesmus for several weeks.  Tonight, she has noted some stinging when she urinates.  She denies abdominal pain or flank pain and denies fever or chills.  Past Medical History:  Diagnosis Date  . Anxiety   . Arthritis   . Asthma   . Depression   . Dysmenorrhea   . Fibromyalgia 2016  . Vaginal delivery 1999,2002,2010,2013    Patient Active Problem List   Diagnosis Date Noted  . Acute gallstone pancreatitis 01/12/2018  . Leukocytosis 01/12/2018  . Elevated LFTs 01/12/2018  . Fibromyalgia 01/03/2018    Past Surgical History:  Procedure Laterality Date  . CERVICAL BIOPSY  W/ LOOP ELECTRODE EXCISION    . CHOLECYSTECTOMY N/A 01/13/2018   Procedure: LAPAROSCOPIC CHOLECYSTECTOMY WITH  INTRAOPERATIVE CHOLANGIOGRAM;  Surgeon: Clovis Riley, MD;  Location: [facility];  Service: General;  Laterality: N/A;  . COLPOSCOPY    . TUBAL LIGATION    . WISDOM TOOTH EXTRACTION  2006     OB History    Gravida  4   Para  4   Term  4   Preterm      AB      Living  4     SAB      TAB      Ectopic      Multiple      Live Births  4            Home Medications    Prior to Admission medications   Medication Sig Start Date End Date Taking? Authorizing Provider  HYDROcodone-acetaminophen (NORCO) 5-325 MG tablet Take 1 tablet by mouth every 6 (six) hours as needed for moderate pain. 01/23/18   Orma Flaming, MD  methocarbamol (ROBAXIN) 500 MG tablet Take 1 tablet (500 mg total) by mouth every 8 (eight) hours as needed for muscle spasms. 01/14/18   Lavina Hamman, MD  ondansetron (ZOFRAN ODT) 4 MG disintegrating tablet Take 1 tablet (4 mg total) by mouth every 8 (eight) hours as needed for nausea or vomiting. Patient not taking: Reported on 01/12/2018 01/03/18   Montine Circle, PA-C  traMADol (ULTRAM) 50 MG tablet Take 1 tablet (50 mg total) by mouth every 6 (six) hours as needed for moderate pain.  01/14/18   Rolly SalterPatel, Pranav M, MD  venlafaxine XR (EFFEXOR-XR) 75 MG 24 hr capsule Take 1 capsule (75 mg total) by mouth daily with breakfast. 01/03/18   Orland MustardWolfe, Allison, MD    Family History Family History  Problem Relation Age of Onset  . Arthritis Mother   . Asthma Mother   . Cancer Mother        liver  . Depression Mother   . Drug abuse Mother   . Early death Mother   . Drug abuse Father   . Heart attack Father   . Heart disease Father   . Kidney disease Father   . Stroke Father   . Stroke Maternal Grandmother     Social History Social History   Tobacco Use  . Smoking status: Current Every Day Smoker    Packs/day: 1.00    Types: Cigarettes  . Smokeless tobacco: Never Used  Substance Use Topics  . Alcohol use: Yes    Alcohol/week: 1.0 - 2.0 standard drinks     Types: 1 - 2 Standard drinks or equivalent per week  . Drug use: Never     Allergies   Patient has no known allergies.   Review of Systems Review of Systems  All other systems reviewed and are negative.    Physical Exam Updated Vital Signs BP (!) 91/53 (BP Location: Left Arm)   Pulse 82   Temp 98.3 F (36.8 C) (Oral)   Resp 18   Ht 5\' 5"  (1.651 m)   Wt 63.8 kg   LMP 01/14/2019   SpO2 96%   BMI 23.41 kg/m   Physical Exam Vitals signs and nursing note reviewed.    38 year old female, resting comfortably and in no acute distress. Vital signs are normal. Oxygen saturation is 96%, which is normal. Head is normocephalic and atraumatic. PERRLA, EOMI. Oropharynx is clear. Neck is nontender and supple without adenopathy or JVD. Back is nontender and there is no CVA tenderness. Lungs are clear without rales, wheezes, or rhonchi. Chest is moderately tender in the parasternal area bilaterally.  There is no crepitus. Heart has regular rate and rhythm without murmur. Abdomen is soft, flat, with mild epigastric tenderness.  There is no rebound or guarding.  There are no masses or hepatosplenomegaly and peristalsis is normoactive. Extremities have no cyanosis or edema, full range of motion is present. Skin is warm and dry without rash. Neurologic: Mental status is normal, cranial nerves are intact, there are no motor or sensory deficits.  ED Treatments / Results  Labs (all labs ordered are listed, but only abnormal results are displayed) Labs Reviewed  URINALYSIS, ROUTINE W REFLEX MICROSCOPIC - Abnormal; Notable for the following components:      Result Value   APPearance HAZY (*)    Nitrite POSITIVE (*)    Leukocytes,Ua MODERATE (*)    Bacteria, UA RARE (*)    All other components within normal limits  BASIC METABOLIC PANEL - Abnormal; Notable for the following components:   Calcium 8.8 (*)    All other components within normal limits  CBC WITH DIFFERENTIAL/PLATELET -  Abnormal; Notable for the following components:   WBC 11.3 (*)    Abs Immature Granulocytes 0.08 (*)    All other components within normal limits  POC URINE PREG, ED  TROPONIN I (HIGH SENSITIVITY)  TROPONIN I (HIGH SENSITIVITY)    EKG EKG Interpretation  Date/Time:  Thursday January 30 2019 04:35:49 EDT Ventricular Rate:  72 PR Interval:  QRS Duration: 88 QT Interval:  405 QTC Calculation: 444 R Axis:   76 Text Interpretation:  Sinus rhythm Normal ECG When compared with ECG of 01/28/2019, No significant change was found Confirmed by Dione BoozeGlick, Ballard Budney ([id number]) on 01/30/2019 4:42:21 AM   Radiology Dg Chest 2 View  Result Date: 01/30/2019 CLINICAL DATA:  Chest pain EXAM: CHEST - 2 VIEW COMPARISON:  Two days ago FINDINGS: Normal heart size and mediastinal contours. No acute infiltrate or edema. No effusion or pneumothorax. No acute osseous findings. Cholecystectomy clips. IMPRESSION: Negative chest. Electronically Signed   By: Marnee SpringJonathon  Watts M.D.   On: 01/30/2019 06:17    Procedures Procedures   Medications Ordered in ED Medications  ketorolac (TORADOL) 30 MG/ML injection 30 mg (has no administration in time range)  nitrofurantoin (macrocrystal-monohydrate) (MACROBID) capsule 100 mg (has no administration in time range)     Initial Impression / Assessment and Plan / ED Course  I have reviewed the triage vital signs and the nursing notes.  Pertinent labs & imaging results that were available during my care of the patient were reviewed by me and considered in my medical decision making (see chart for details).  Chest pain which seems somewhat atypical.  Suspect chest wall pain.  Failure to respond to antacids and proton pump inhibitors suggest that.  And peptic ulcer disease likely not the cause of her pain.  No symptoms to suggest just pneumonia, PERC negative so unlikely to have pulmonary embolism.  Physical findings suggestive of chest wall pain.  Urinary symptoms are suggestive of  UTI.  Urinalysis today does confirm presence of UTI with positive nitrite.  She will be started on nitrofurantoin.  We will double check chest x-ray and labs.  Will give therapeutic trial of ketorolac.  ECG today is normal and unchanged from prior.  Chest x-ray and labs are reassuring.  She had excellent relief of pain with ketorolac.  I do not feel she needs a repeat troponin given negative troponin from 2 days ago and today and response to ketorolac.  She is discharged with prescriptions for ketorolac and nitrofurantoin, follow-up with PCP in 1 week.  Return precautions discussed.  Final Clinical Impressions(s) / ED Diagnoses   Final diagnoses:  Chest wall pain  Urinary tract infection without hematuria, site unspecified    ED Discharge Orders         Ordered    nitrofurantoin, macrocrystal-monohydrate, (MACROBID) 100 MG capsule  2 times daily     01/30/19 0708    naproxen (NAPROSYN) 500 MG tablet  2 times daily     01/30/19 0708           Dione BoozeGlick, Ivett Luebbe, MD 01/30/19 [phone]

## 2019-01-30 NOTE — ED Notes (Signed)
Patient given discharge teaching and verbalized understanding. Patient ambulated out of ED with a steady gait. 

## 2019-01-30 NOTE — ED Triage Notes (Signed)
Pt reports epigastric pain and shoulder pain x 3 days. She states that she was seen at [facility] and got a cardiac workup that came back normal, but she doesn't feel any better. She states that she is feeling tired and slow. Has tried omeprazole and tums without relief. Also reports burning urination.

## 2019-01-30 NOTE — Discharge Instructions (Addendum)
Please take the medications as prescribed.  You may take acetaminophen as needed for additional pain relief.  Try applying ice to sore areas.  That may give additional pain relief.  Return if symptoms are getting worse.

## 2019-02-01 ENCOUNTER — Encounter (HOSPITAL_COMMUNITY): Payer: Self-pay

## 2019-02-01 ENCOUNTER — Other Ambulatory Visit: Payer: Self-pay

## 2019-02-01 ENCOUNTER — Emergency Department (HOSPITAL_COMMUNITY)
Admission: EM | Admit: 2019-02-01 | Discharge: 2019-02-01 | Disposition: A | Discharge status: Home / Self Care | Payer: Medicaid Other | Attending: Emergency Medicine | Admitting: Emergency Medicine

## 2019-02-01 DIAGNOSIS — L738 Other specified follicular disorders: Secondary | ICD-10-CM

## 2019-02-01 DIAGNOSIS — R21 Rash and other nonspecific skin eruption: Secondary | ICD-10-CM | POA: Insufficient documentation

## 2019-02-01 DIAGNOSIS — B999 Unspecified infectious disease: Secondary | ICD-10-CM

## 2019-02-01 MED ORDER — DICLOXACILLIN SODIUM 500 MG PO CAPS: 500.0000 mg | ORAL_CAPSULE | Freq: Four times a day (QID) | ORAL | 0 refills | Status: DC

## 2019-02-01 MED ORDER — CIPROFLOXACIN HCL 500 MG PO TABS: 500.0000 mg | ORAL_TABLET | Freq: Two times a day (BID) | ORAL | 0 refills | Status: DC

## 2019-02-01 NOTE — ED Provider Notes (Signed)
[facility]-EMERGENCY DEPT Provider Note   CSN: [id number] Arrival date & time: 02/01/19  1438     History   Chief Complaint Chief Complaint  Patient presents with  . Rash    HPI Kelli Washington is a 38 y.o. female.     HPI Patient presents to the emergency department with a rash that is also present on her boyfriend.  The patient states that they have been swimming multiple times recently and noted after that this started.  Patient has no itching with the rash.  She states that her ears are very small and just small red areas.  Patient denies any fever, nausea, vomiting, weakness, dizziness, headache, blurred vision near syncope or syncope. Past Medical History:  Diagnosis Date  . Anxiety   . Arthritis   . Asthma   . Depression   . Dysmenorrhea   . Fibromyalgia 2016  . Vaginal delivery 1999,2002,2010,2013    Patient Active Problem List   Diagnosis Date Noted  . Acute gallstone pancreatitis 01/12/2018  . Leukocytosis 01/12/2018  . Elevated LFTs 01/12/2018  . Fibromyalgia 01/03/2018    Past Surgical History:  Procedure Laterality Date  . CERVICAL BIOPSY  W/ LOOP ELECTRODE EXCISION    . CHOLECYSTECTOMY N/A 01/13/2018   Procedure: LAPAROSCOPIC CHOLECYSTECTOMY WITH INTRAOPERATIVE CHOLANGIOGRAM;  Surgeon: Berna Bueonnor, Chelsea A, MD;  Location: [facility];  Service: General;  Laterality: N/A;  . COLPOSCOPY    . TUBAL LIGATION    . WISDOM TOOTH EXTRACTION  2006     OB History    Gravida  4   Para  4   Term  4   Preterm      AB      Living  4     SAB      TAB      Ectopic      Multiple      Live Births  4            Home Medications    Prior to Admission medications   Medication Sig Start Date End Date Taking? Authorizing Provider  albuterol (VENTOLIN HFA) 108 (90 Base) MCG/ACT inhaler Inhale 1-2 puffs into the lungs every 6 (six) hours as needed for wheezing or shortness of breath.    [provider]  naproxen  (NAPROSYN) 500 MG tablet Take 1 tablet (500 mg total) by mouth 2 (two) times daily. 01/30/19   Dione BoozeGlick, David, MD  nitrofurantoin, macrocrystal-monohydrate, (MACROBID) 100 MG capsule Take 1 capsule (100 mg total) by mouth 2 (two) times daily. X 7 days 01/30/19   Dione BoozeGlick, David, MD  omeprazole (PRILOSEC OTC) 20 MG tablet Take 20 mg by mouth daily.    [provider]  venlafaxine XR (EFFEXOR-XR) 75 MG 24 hr capsule Take 1 capsule (75 mg total) by mouth daily with breakfast. Patient not taking: Reported on 01/30/2019 01/03/18   Orland MustardWolfe, Allison, MD    Family History Family History  Problem Relation Age of Onset  . Arthritis Mother   . Asthma Mother   . Cancer Mother        liver  . Depression Mother   . Drug abuse Mother   . Early death Mother   . Drug abuse Father   . Heart attack Father   . Heart disease Father   . Kidney disease Father   . Stroke Father   . Stroke Maternal Grandmother     Social History Social History   Tobacco Use  . Smoking status: Current  Every Day Smoker    Packs/day: 1.00    Types: Cigarettes  . Smokeless tobacco: Never Used  Substance Use Topics  . Alcohol use: Yes    Alcohol/week: 1.0 - 2.0 standard drinks    Types: 1 - 2 Standard drinks or equivalent per week  . Drug use: Never     Allergies   Patient has no known allergies.   Review of Systems Review of Systems All other systems negative except as documented in the HPI. All pertinent positives and negatives as reviewed in the HPI.  Physical Exam Updated Vital Signs BP (!) 132/91 (BP Location: Right Arm)   Pulse 92   Temp 98 F (36.7 C) (Oral)   Resp 18   Ht 5\' 5"  (1.651 m)   Wt 63.5 kg   LMP 01/14/2019   SpO2 96%   BMI 23.30 kg/m   Physical Exam Vitals signs and nursing note reviewed.  Constitutional:      General: She is not in acute distress.    Appearance: She is well-developed.  HENT:     Head: Normocephalic and atraumatic.  Eyes:     Pupils: Pupils are equal, round,  and reactive to light.  Pulmonary:     Effort: Pulmonary effort is normal.  Skin:    General: Skin is warm and dry.       Neurological:     Mental Status: She is alert and oriented to person, place, and time.      ED Treatments / Results  Labs (all labs ordered are listed, but only abnormal results are displayed) Labs Reviewed - No data to display  EKG None  Radiology No results found.  Procedures Procedures (including critical care time)  Medications Ordered in ED Medications - No data to display   Initial Impression / Assessment and Plan / ED Course  I have reviewed the triage vital signs and the nursing notes.  Pertinent labs & imaging results that were available during my care of the patient were reviewed by me and considered in my medical decision making (see chart for details).      Will treat for a hot tub folliculitis type rash.  Told to return here as needed.  Patient is advised to follow-up with primary doctor.    Final Clinical Impressions(s) / ED Diagnoses   Final diagnoses:  None    ED Discharge Orders    None       Rebeca Allegra 02/01/19 1631    Julianne Rice, MD 02/01/19 2131

## 2019-02-01 NOTE — Discharge Instructions (Addendum)
Return here as needed. Follow up with a primary doctor. °

## 2019-02-01 NOTE — ED Triage Notes (Signed)
States bumps for a couple of days states boyfriend gave them to her no itching voiced.

## 2019-02-07 ENCOUNTER — Other Ambulatory Visit: Payer: Self-pay

## 2019-02-07 ENCOUNTER — Encounter (HOSPITAL_COMMUNITY): Payer: Self-pay

## 2019-02-07 DIAGNOSIS — Z79899 Other long term (current) drug therapy: Secondary | ICD-10-CM | POA: Insufficient documentation

## 2019-02-07 DIAGNOSIS — R21 Rash and other nonspecific skin eruption: Secondary | ICD-10-CM | POA: Insufficient documentation

## 2019-02-07 DIAGNOSIS — J45909 Unspecified asthma, uncomplicated: Secondary | ICD-10-CM | POA: Insufficient documentation

## 2019-02-07 DIAGNOSIS — F17210 Nicotine dependence, cigarettes, uncomplicated: Secondary | ICD-10-CM | POA: Insufficient documentation

## 2019-02-07 NOTE — ED Triage Notes (Signed)
Pt reports continued generalized rash and bumps. Urgent Care told her it was scabies, but she doesn't believe them.

## 2019-02-08 ENCOUNTER — Emergency Department (HOSPITAL_COMMUNITY)
Admission: EM | Admit: 2019-02-08 | Discharge: 2019-02-08 | Disposition: A | Discharge status: Home / Self Care | Payer: Medicaid Other | Attending: Emergency Medicine | Admitting: Emergency Medicine

## 2019-02-08 DIAGNOSIS — R21 Rash and other nonspecific skin eruption: Secondary | ICD-10-CM

## 2019-02-08 NOTE — ED Notes (Addendum)
Pt was verbalized discharge instructions. Pt had no further questions at this time. NAD. 

## 2019-02-08 NOTE — ED Provider Notes (Signed)
[facility] DEPT Provider Note: Georgena Spurling, MD, FACEP  CSN: [id number] MRN: [id number] ARRIVAL: 02/07/19 at 7 ROOM: [place]  02/08/19 12:06 AM Kelli Washington is a 38 y.o. female who complains of about a 7-day history of a rash.  The rash is a fine maculopapular rash.  It is present primarily on her legs but is recently become present on the palms of her hands.  It has not been pruritic until yesterday and only mildly so.  She was diagnosed with hot tub folliculitis on the 55DD and treated with ciprofloxacin which did not resolve the rash.  She took her son to an urgent care yesterday for similar rash and was told it was scabies.  She has checked her bedding for bedbugs and did not find any evidence.  She is here with her daughter who is had similar symptoms for the past day.  She denies any systemic symptoms suggestive of a viral illness.   Past Medical History:  Diagnosis Date  . Anxiety   . Arthritis   . Asthma   . Depression   . Dysmenorrhea   . Fibromyalgia 2016  . Vaginal delivery 1999,2002,2010,2013    Past Surgical History:  Procedure Laterality Date  . CERVICAL BIOPSY  W/ LOOP ELECTRODE EXCISION    . CHOLECYSTECTOMY N/A 01/13/2018   Procedure: LAPAROSCOPIC CHOLECYSTECTOMY WITH INTRAOPERATIVE CHOLANGIOGRAM;  Surgeon: Clovis Riley, MD;  Location: [facility];  Service: General;  Laterality: N/A;  . COLPOSCOPY    . TUBAL LIGATION    . WISDOM TOOTH EXTRACTION  2006    Family History  Problem Relation Age of Onset  . Arthritis Mother   . Asthma Mother   . Cancer Mother        liver  . Depression Mother   . Drug abuse Mother   . Early death Mother   . Drug abuse Father   . Heart attack Father   . Heart disease Father   . Kidney disease Father   . Stroke Father   . Stroke Maternal Grandmother     Social History   Tobacco Use  . Smoking status: Current Every Day Smoker    Packs/day: 1.00   Types: Cigarettes  . Smokeless tobacco: Never Used  Substance Use Topics  . Alcohol use: Yes    Alcohol/week: 1.0 - 2.0 standard drinks    Types: 1 - 2 Standard drinks or equivalent per week  . Drug use: Never    Prior to Admission medications   Medication Sig Start Date End Date Taking? Authorizing Provider  albuterol (VENTOLIN HFA) 108 (90 Base) MCG/ACT inhaler Inhale 1-2 puffs into the lungs every 6 (six) hours as needed for wheezing or shortness of breath.    [provider]  naproxen (NAPROSYN) 500 MG tablet Take 1 tablet (500 mg total) by mouth 2 (two) times daily. 08/30/23   Delora Fuel, MD  nitrofurantoin, macrocrystal-monohydrate, (MACROBID) 100 MG capsule Take 1 capsule (100 mg total) by mouth 2 (two) times daily. X 7 days 11/03/04   Delora Fuel, MD  omeprazole (PRILOSEC OTC) 20 MG tablet Take 20 mg by mouth daily.    [provider]  venlafaxine XR (EFFEXOR-XR) 75 MG 24 hr capsule Take 1 capsule (75 mg total) by mouth daily with breakfast. Patient not taking: Reported on 01/30/2019 01/03/18 02/08/19  Orma Flaming, MD    Allergies Patient has no known allergies.   REVIEW OF SYSTEMS  Negative except as noted here or in the History of Present Illness.   PHYSICAL EXAMINATION  Initial Vital Signs Blood pressure 114/82, pulse 85, temperature 98.3 F (36.8 C), temperature source Oral, resp. rate 16, height 5\' 5"  (1.651 m), weight 63.5 kg, last menstrual period 01/14/2019, SpO2 99 %.  Examination General: Well-developed, well-nourished female in no acute distress; appearance consistent with age of record HENT: normocephalic; atraumatic Eyes: Normal appearance Neck: supple Heart: regular rate and rhythm Lungs: clear to auscultation bilaterally Abdomen: soft; nondistended; nontender; bowel sounds present Extremities: No deformity; full range of motion Neurologic: Awake, alert and oriented; motor function intact in all extremities and symmetric; no facial  droop Skin: Warm and dry; fine rash most prominent on the left thigh; no linear pattern of lesions and no lesions on dorsal hands:    Psychiatric: Normal mood and affect   RESULTS  Summary of this visit's results, reviewed by myself:   EKG Interpretation  Date/Time:    Ventricular Rate:    PR Interval:    QRS Duration:   QT Interval:    QTC Calculation:   R Axis:     Text Interpretation:        Laboratory Studies: No results found for this or any previous visit (from the past 24 hour(s)). Imaging Studies: No results found.  ED COURSE and MDM  Nursing notes and initial vitals signs, including pulse oximetry, reviewed.  Vitals:   02/07/19 2344  BP: 114/82  Pulse: 85  Resp: 16  Temp: 98.3 F (36.8 C)  TempSrc: Oral  SpO2: 99%  Weight: 63.5 kg  Height: 5\' 5"  (1.651 m)   Patient advised the rash is nonspecific but is not consistent with scabies and does not appear to be bedbugs.  Because it has been affecting multiple family members some type of infestation seems most likely.  She was advised to treat the symptoms with topical hydrocortisone cream and oral antihistamines and we will refer to dermatology for definitive work-up.  PROCEDURES    ED DIAGNOSES     ICD-10-CM   1. Rash  R21        Zilah Villaflor, Jonny RuizJohn, MD 02/08/19 [phone]

## 2019-02-09 ENCOUNTER — Other Ambulatory Visit: Payer: Self-pay

## 2019-02-09 ENCOUNTER — Emergency Department (HOSPITAL_COMMUNITY)
Admission: EM | Admit: 2019-02-09 | Discharge: 2019-02-09 | Discharge status: Against Medical Advice | Payer: Medicaid Other | Attending: Emergency Medicine | Admitting: Emergency Medicine

## 2019-02-09 ENCOUNTER — Encounter (HOSPITAL_COMMUNITY): Payer: Self-pay

## 2019-02-09 DIAGNOSIS — Z5321 Procedure and treatment not carried out due to patient leaving prior to being seen by health care provider: Secondary | ICD-10-CM | POA: Insufficient documentation

## 2019-02-09 NOTE — ED Notes (Signed)
Registration stated that patient left AMA

## 2019-02-15 ENCOUNTER — Encounter (HOSPITAL_COMMUNITY): Payer: Self-pay

## 2019-02-15 ENCOUNTER — Other Ambulatory Visit: Payer: Self-pay

## 2019-02-15 ENCOUNTER — Emergency Department (HOSPITAL_COMMUNITY)
Admission: EM | Admit: 2019-02-15 | Discharge: 2019-02-15 | Disposition: A | Discharge status: Home / Self Care | Payer: Medicaid Other | Attending: Emergency Medicine | Admitting: Emergency Medicine

## 2019-02-15 DIAGNOSIS — R21 Rash and other nonspecific skin eruption: Secondary | ICD-10-CM | POA: Insufficient documentation

## 2019-02-15 DIAGNOSIS — Z79899 Other long term (current) drug therapy: Secondary | ICD-10-CM | POA: Insufficient documentation

## 2019-02-15 DIAGNOSIS — R634 Abnormal weight loss: Secondary | ICD-10-CM | POA: Insufficient documentation

## 2019-02-15 DIAGNOSIS — F17210 Nicotine dependence, cigarettes, uncomplicated: Secondary | ICD-10-CM | POA: Insufficient documentation

## 2019-02-15 DIAGNOSIS — J45909 Unspecified asthma, uncomplicated: Secondary | ICD-10-CM | POA: Insufficient documentation

## 2019-02-15 DIAGNOSIS — R0602 Shortness of breath: Secondary | ICD-10-CM

## 2019-02-15 MED ORDER — PERMETHRIN 5 % EX CREA: TOPICAL_CREAM | CUTANEOUS | 0 refills | Status: DC

## 2019-02-15 NOTE — ED Provider Notes (Signed)
[facility]-EMERGENCY DEPT Provider Note   CSN: [id number] Arrival date & time: 02/15/19  0004     History   Chief Complaint Chief Complaint  Patient presents with  . Shortness of Breath  . Pruritis    HPI Kelli Washington is a 38 y.o. female.     Patient to ED with multiple complaints. She has a rash that has been ongoing since 02/01/19 when she was diagnosed with hot tub folliculitis. She completed the antibiotic prescription provided without change in the rash. She describes areas that develop intermittently and has seen "bugs come from under the skin". She reports having taken photos and videos of same. There are several people in the house with similar symptoms. No fever, or itching. She also complains of chest discomfort and SoB for several weeks, associated with a 15 pound weight loss. No vomiting.   The history is provided by the patient. No language interpreter was used.    Past Medical History:  Diagnosis Date  . Anxiety   . Arthritis   . Asthma   . Depression   . Dysmenorrhea   . Fibromyalgia 2016  . Vaginal delivery 1999,2002,2010,2013    Patient Active Problem List   Diagnosis Date Noted  . Acute gallstone pancreatitis 01/12/2018  . Leukocytosis 01/12/2018  . Elevated LFTs 01/12/2018  . Fibromyalgia 01/03/2018    Past Surgical History:  Procedure Laterality Date  . CERVICAL BIOPSY  W/ LOOP ELECTRODE EXCISION    . CHOLECYSTECTOMY N/A 01/13/2018   Procedure: LAPAROSCOPIC CHOLECYSTECTOMY WITH INTRAOPERATIVE CHOLANGIOGRAM;  Surgeon: Berna Bueonnor, Chelsea A, MD;  Location: [facility]S;  Service: General;  Laterality: N/A;  . COLPOSCOPY    . TUBAL LIGATION    . WISDOM TOOTH EXTRACTION  2006     OB History    Gravida  4   Para  4   Term  4   Preterm      AB      Living  4     SAB      TAB      Ectopic      Multiple      Live Births  4            Home Medications    Prior to Admission medications   Medication Sig Start  Date End Date Taking? Authorizing Provider  albuterol (VENTOLIN HFA) 108 (90 Base) MCG/ACT inhaler Inhale 1-2 puffs into the lungs every 6 (six) hours as needed for wheezing or shortness of breath.   Yes [provider]  ciprofloxacin (CIPRO) 500 MG tablet Take 500 mg by mouth 2 (two) times daily. 02/02/19  Yes [provider]  ibuprofen (ADVIL) 200 MG tablet Take 600 mg by mouth every 6 (six) hours as needed for moderate pain.   Yes [provider]  omeprazole (PRILOSEC OTC) 20 MG tablet Take 20 mg by mouth daily.   Yes [provider]  naproxen (NAPROSYN) 500 MG tablet Take 1 tablet (500 mg total) by mouth 2 (two) times daily. Patient not taking: Reported on 02/15/2019 01/30/19   Dione BoozeGlick, David, MD  nitrofurantoin, macrocrystal-monohydrate, (MACROBID) 100 MG capsule Take 1 capsule (100 mg total) by mouth 2 (two) times daily. X 7 days Patient not taking: Reported on 02/15/2019 01/30/19   Dione BoozeGlick, David, MD  venlafaxine XR (EFFEXOR-XR) 75 MG 24 hr capsule Take 1 capsule (75 mg total) by mouth daily with breakfast. Patient not taking: Reported on 01/30/2019 01/03/18 02/08/19  Orland MustardWolfe, Allison, MD  Family History Family History  Problem Relation Age of Onset  . Arthritis Mother   . Asthma Mother   . Cancer Mother        liver  . Depression Mother   . Drug abuse Mother   . Early death Mother   . Drug abuse Father   . Heart attack Father   . Heart disease Father   . Kidney disease Father   . Stroke Father   . Stroke Maternal Grandmother     Social History Social History   Tobacco Use  . Smoking status: Current Every Day Smoker    Packs/day: 1.00    Types: Cigarettes  . Smokeless tobacco: Never Used  Substance Use Topics  . Alcohol use: Yes    Alcohol/week: 1.0 - 2.0 standard drinks    Types: 1 - 2 Standard drinks or equivalent per week  . Drug use: Never     Allergies   Patient has no known allergies.   Review of Systems Review of Systems   Constitutional: Positive for unexpected weight change. Negative for fever.  HENT: Negative for congestion.   Respiratory: Positive for cough and shortness of breath.   Cardiovascular: Positive for chest pain.  Gastrointestinal: Negative for abdominal pain.  Musculoskeletal: Negative for myalgias.  Skin: Positive for rash.     Physical Exam Updated Vital Signs BP (!) 126/100 (BP Location: Right Arm)   Pulse 81   Temp 98.8 F (37.1 C) (Oral)   Resp 16   Ht 5\' 5"  (1.651 m)   Wt 59.9 kg   SpO2 98%   BMI 21.97 kg/m   Physical Exam Vitals signs and nursing note reviewed.  Neck:     Musculoskeletal: Normal range of motion and neck supple.  Cardiovascular:     Rate and Rhythm: Normal rate and regular rhythm.     Heart sounds: No murmur.  Pulmonary:     Effort: Pulmonary effort is normal.     Breath sounds: No wheezing, rhonchi or rales.  Musculoskeletal: Normal range of motion.     Right lower leg: No edema.     Left lower leg: No edema.  Skin:    General: Skin is warm and dry.     Comments: Singular, pinpoint, erythematous, raised areas on feel, lower legs, forearms. No confluence, pustules. No infestation visualized.       ED Treatments / Results  Labs (all labs ordered are listed, but only abnormal results are displayed) Labs Reviewed - No data to display  EKG None  Radiology No results found.  Procedures Procedures (including critical care time)  Medications Ordered in ED Medications - No data to display   Initial Impression / Assessment and Plan / ED Course  I have reviewed the triage vital signs and the nursing notes.  Pertinent labs & imaging results that were available during my care of the patient were reviewed by me and considered in my medical decision making (see chart for details).        Patient to ED with ongoing symptoms of rash for 2 weeks, chest discomfort, SoB, cough for the past 2 months. Seen in the ED multiple times. Has been on  abx, topical hydrocortisone and benadryl without relief. Multiple household members affected by the rash.   She is well appearing. Rash is nonspecific. Since multiple household members affected, and a other measures unsuccessful, will trial Elemite. She has a pending appointment with dermatology for further outpatient evaluation.   No hypoxia or abnormal  lung sounds. No fever. CXR offered but patient reports she has had studies done that were negative. She has a pending appointment with PCP next week.   She is stable for discharge home.   Final Clinical Impressions(s) / ED Diagnoses   Final diagnoses:  None   1. Nonspecific rash 2. SOB 3. Unintentional weight loss  ED Discharge Orders    None       Elpidio AnisUpstill, Nyomie Ehrlich, PA-C 02/15/19 [id number]    Nira Connardama, Pedro Eduardo, MD 02/15/19 [phone]

## 2019-02-15 NOTE — ED Triage Notes (Signed)
Pt reports SOB and cough for the last several weeks. She states that she has been seen for same. She also reports that her "skin is crawling." A&Ox4. Ambulatory.

## 2019-02-15 NOTE — ED Notes (Signed)
Pt states he has a concern related to sudden weight loss,  And skin redness and changes in vision.

## 2019-02-15 NOTE — Discharge Instructions (Addendum)
Use the cream as directed.   Keep your scheduled appointments with dermatology and your primary care physician for further outpatient evaluation.

## 2019-02-17 ENCOUNTER — Ambulatory Visit (INDEPENDENT_AMBULATORY_CARE_PROVIDER_SITE_OTHER): Payer: Self-pay | Admitting: Family Medicine

## 2019-02-17 ENCOUNTER — Other Ambulatory Visit: Payer: Self-pay

## 2019-02-17 ENCOUNTER — Encounter: Payer: Self-pay | Admitting: Family Medicine

## 2019-02-17 VITALS — BP 144/64 | HR 124 | Temp 98.2°F | Ht 65.0 in | Wt 129.0 lb

## 2019-02-17 DIAGNOSIS — B89 Unspecified parasitic disease: Secondary | ICD-10-CM

## 2019-02-17 DIAGNOSIS — L989 Disorder of the skin and subcutaneous tissue, unspecified: Secondary | ICD-10-CM

## 2019-02-17 LAB — COMPREHENSIVE METABOLIC PANEL
ALT: 9 U/L (ref 0–35)
AST: 11 U/L (ref 0–37)
Albumin: 4.5 g/dL (ref 3.5–5.2)
Alkaline Phosphatase: 70 U/L (ref 39–117)
BUN: 16 mg/dL (ref 6–23)
CO2: 25 mEq/L (ref 19–32)
Calcium: 9.7 mg/dL (ref 8.4–10.5)
Chloride: 104 mEq/L (ref 96–112)
Creatinine, Ser: 0.73 mg/dL (ref 0.40–1.20)
GFR: 89.39 mL/min (ref >60.00)
Glucose, Bld: 98 mg/dL (ref 70–99)
Potassium: 4 mEq/L (ref 3.5–5.1)
Sodium: 140 mEq/L (ref 135–145)
Total Bilirubin: 0.4 mg/dL (ref 0.2–1.2)
Total Protein: 7.1 g/dL (ref 6.0–8.3)

## 2019-02-17 LAB — CBC WITH DIFFERENTIAL/PLATELET
Basophils Absolute: 0.1 10*3/uL (ref 0.0–0.1)
Basophils Relative: 0.6 % (ref 0.0–3.0)
Eosinophils Absolute: 0.1 10*3/uL (ref 0.0–0.7)
Eosinophils Relative: 0.5 % (ref 0.0–5.0)
HCT: 46.4 % — ABNORMAL HIGH (ref 36.0–46.0)
Hemoglobin: 15.8 g/dL — ABNORMAL HIGH (ref 12.0–15.0)
Lymphocytes Relative: 24.5 % (ref 12.0–46.0)
Lymphs Abs: 2.5 10*3/uL (ref 0.7–4.0)
MCHC: 34.1 g/dL (ref 30.0–36.0)
MCV: 89.7 fl (ref 78.0–100.0)
Monocytes Absolute: 0.6 10*3/uL (ref 0.1–1.0)
Monocytes Relative: 6.2 % (ref 3.0–12.0)
Neutro Abs: 7.1 10*3/uL (ref 1.4–7.7)
Neutrophils Relative %: 68.2 % (ref 43.0–77.0)
Platelets: 200 10*3/uL (ref 150.0–400.0)
RBC: 5.17 Mil/uL — ABNORMAL HIGH (ref 3.87–5.11)
RDW: 13.6 % (ref 11.5–15.5)
WBC: 10.4 10*3/uL (ref 4.0–10.5)

## 2019-02-17 LAB — C-REACTIVE PROTEIN: CRP: <1 mg/dL (ref 0.5–20.0)

## 2019-02-17 LAB — SEDIMENTATION RATE: Sed Rate: 18 mm/hr (ref 0–20)

## 2019-02-17 NOTE — Progress Notes (Signed)
Patient: Amariz Flamenco MRN: [id number] DOB: [birth year] PCP: Orma Flaming, MD     Subjective:  Chief Complaint  Patient presents with  . Follow-up    ED f/u for poss parasites?    HPI: The patient is a 38 y.o. female who presents today for rash that has been going on since beginning of July. Seen in ER for first rash visit on 02/01/2019. Treated for folliculitis with cipro initially and then recently given medication for scabies. She never used medication for scabies. Also has tried steroid cream and benadryl without success.  Her daughter and son have the same rash and her daughter has appointment with dermatology this month. She states she treated children for scabies, but this did not work. They have pulled bugs out of their skin. They have not had an exterminator come out and does not see bugs in the bed/crevices. She did not use the treatment for the scabies medication. She states when the sores first pop up they itch. When she is cold they seem to get worse. Lesions are round, linear form and can be white or red. She denies any drug use. I specifically asked about meth and she said no drugs. She is having a lot of systemic symptoms and is worried about leishmaniasis.   She also has chest pain and shortness of breath that started in July as well. She states she is coughing up bugs. She has pictures of this, but they are not clear evidence for bugs. She has not had any international travel, but [place] and got stung by a bug. Since this time she has had symptoms. She has pictures of these bugs getting pulled out of skin, but again, I can not tell that they are bugs. She showed me one today and it appears to be a scab.   She has lost 25 pounds in the last month and a half. She is eating like normal. Stopped her effexor as she has no money.   Review of Systems  Constitutional: Positive for fatigue. Negative for chills and fever.  HENT: Positive for trouble swallowing.    Respiratory: Positive for shortness of breath.   Cardiovascular: Positive for chest pain.  Gastrointestinal: Positive for nausea. Negative for diarrhea and vomiting.  Musculoskeletal: Positive for arthralgias. Negative for back pain and neck pain.       B/l shoulder pain  Skin: Positive for rash.  Neurological: Positive for headaches. Negative for dizziness.  Psychiatric/Behavioral: Positive for sleep disturbance. The patient is nervous/anxious.     Allergies Patient has No Known Allergies.  Past Medical History Patient  has a past medical history of Anxiety, Arthritis, Asthma, Depression, Dysmenorrhea, Fibromyalgia (2016), and Vaginal delivery (1999,2002,2010,2013).  Surgical History Patient  has a past surgical history that includes Tubal ligation; Wisdom tooth extraction (2006); Cervical biopsy w/ loop electrode excision; Colposcopy; and Cholecystectomy (N/A, 01/13/2018).  Family History Pateint's family history includes Arthritis in her mother; Asthma in her mother; Cancer in her mother; Depression in her mother; Drug abuse in her father and mother; Early death in her mother; Heart attack in her father; Heart disease in her father; Kidney disease in her father; Stroke in her father and maternal grandmother.  Social History Patient  reports that she has been smoking cigarettes. She has been smoking about 1.00 pack per day. She has never used smokeless tobacco. She reports current alcohol use of about 1.0 - 2.0 standard drinks of alcohol per week. She reports that she does not use  drugs.    Objective: Vitals:   02/17/19 0953  BP: (!) 144/64  Pulse: (!) 124  Temp: 98.2 F (36.8 C)  TempSrc: Temporal  SpO2: 97%  Weight: 129 lb (58.5 kg)  Height: 5\' 5"  (1.651 m)    Body mass index is 21.47 kg/m.  Physical Exam Vitals signs reviewed.  Constitutional:      Appearance: Normal appearance.     Comments: Nervous appearing  HENT:     Head: Normocephalic and atraumatic.      Nose: Nose normal.     Mouth/Throat:     Comments: Ulcer on left faucial pillar, no other lesions in oral mucosa Eyes:     Extraocular Movements: Extraocular movements intact.  Neck:     Musculoskeletal: Normal range of motion and neck supple.  Cardiovascular:     Rate and Rhythm: Regular rhythm. Tachycardia present.     Heart sounds: Normal heart sounds. No murmur.  Pulmonary:     Effort: Pulmonary effort is normal. No respiratory distress.     Breath sounds: Normal breath sounds. No wheezing or rales.  Abdominal:     General: Abdomen is flat. Bowel sounds are normal.     Palpations: Abdomen is soft.  Skin:    Findings: Lesion (scabbed lesions on left lateral leg) and rash (skin colored/red macules. ) present.  Neurological:     Mental Status: She is alert.  Psychiatric:     Comments: Poor eye contact, fidgety         Assessment/plan: 1. Parasite infection She is very convinced she has leishmaniasis. Discussed with her the possibility is so rare as she has not traveled anywhere out of [place]. No lesions look like either old or new world disease even though I have never seen this in person. Lesions more typical of scabies and advised she do the following. First get exterminator out to look at bugs and see if they can verify what they are dealing with and rule out bed bugs. Treat herself for scabies as this seems the most obvious to me.  Her kid also has appointment with dermatology for same rash. She is convinced she has leishmaniasis and is adamant she has coughed up bug. Due to her anxiety and systemic symptoms will send to ID, but they are going to need to verify parasite. Encouraged her to save sputum/bugs to take with her. Will also check labs, but last cbc had no eosinophilia shift, again making parasitic infection less likely, but not impossible.  - Ambulatory referral to Infectious Disease - CBC with Differential/Platelet - C-reactive protein - Sedimentation rate - Comprehensive  metabolic panel  2. Skin lesion Checking UDS, treating for scabies, referral to ID to r/o parasite.  - Pain Mgmt, Profile 8 w/Conf, U  3. Weight loss -likely due to anxiety, but checking for drugs and sending to ID to make sure no chance of parasitic infection.  Declines anxiety treatment at this time,but I think she needs this. Lab work essentially normal, but UDS pending.  .   Return if symptoms worsen or fail to improve.   Orland MustardAllison Marcelis Wissner, MD [facility]   02/19/2019

## 2019-02-19 ENCOUNTER — Encounter: Payer: Self-pay | Admitting: Internal Medicine

## 2019-02-19 ENCOUNTER — Ambulatory Visit (INDEPENDENT_AMBULATORY_CARE_PROVIDER_SITE_OTHER): Payer: Medicaid Other | Admitting: Internal Medicine

## 2019-02-19 DIAGNOSIS — L989 Disorder of the skin and subcutaneous tissue, unspecified: Secondary | ICD-10-CM | POA: Insufficient documentation

## 2019-02-19 LAB — PAIN MGMT, PROFILE 8 W/CONF, U
6 Acetylmorphine: NEGATIVE ng/mL
Alcohol Metabolites: NEGATIVE ng/mL (ref <500)
Amphetamine: 7566 ng/mL
Amphetamines: POSITIVE ng/mL
Benzodiazepines: NEGATIVE ng/mL
Buprenorphine, Urine: NEGATIVE ng/mL
Cocaine Metabolite: NEGATIVE ng/mL
Creatinine: 216.8 mg/dL
MDMA: NEGATIVE ng/mL
Marijuana Metabolite: 1907 ng/mL
Marijuana Metabolite: POSITIVE ng/mL
Methamphetamine: 35562 ng/mL
Opiates: NEGATIVE ng/mL
Oxidant: NEGATIVE ug/mL
Oxycodone: NEGATIVE ng/mL
pH: 6.7 (ref 4.5–9.0)

## 2019-02-19 NOTE — Progress Notes (Addendum)
[facility] for Infectious Disease      Reason for Consult: concern for leishmaniasis    Referring Physician: Dr. Rogers Blocker    Patient ID: Kelli Washington, female    DOB: 05/04/1981, 38 y.o.   MRN: [id number]  HPI:   Patient is worked in today for a new patient visit.  She reports about a 1 month history of bites or stings she has noted [place].  Lesions now in boyfriend and two children.  They do not itch but sting still.  She brought in samples of what she feels is leishmaniasis, though she has not been to an endemic area.  No mucocutaneous concerns.  Had them a month ago.  Has lost 25 lbs since then.  Eating the same, no decrease in po intake.  Has been to the ED 5 times and her PCP once.  Has used permethrin for presumed scabies with no improvement.  Also has had her kids and boyfriend treated and environmental cleaning.  Also reports that she coughs up parasites.  Brings in two containers with samples to be evaluated and can see parasites.  Recent labs noted and no eosinophilia, normal wbc.     Past Medical History:  Diagnosis Date  . Anxiety   . Arthritis   . Asthma   . Depression   . Dysmenorrhea   . Fibromyalgia 2016  . Vaginal delivery 1999,2002,2010,2013    Prior to Admission medications   Medication Sig Start Date End Date Taking? Authorizing Provider  albuterol (VENTOLIN HFA) 108 (90 Base) MCG/ACT inhaler Inhale 1-2 puffs into the lungs every 6 (six) hours as needed for wheezing or shortness of breath.   Yes [provider]  ibuprofen (ADVIL) 200 MG tablet Take 600 mg by mouth every 6 (six) hours as needed for moderate pain.   Yes [provider]  naproxen (NAPROSYN) 500 MG tablet Take 1 tablet (500 mg total) by mouth 2 (two) times daily. 9/48/54  Yes Delora Fuel, MD  omeprazole (PRILOSEC OTC) 20 MG tablet Take 20 mg by mouth daily.   Yes [provider]  ciprofloxacin (CIPRO) 500 MG tablet Take 500 mg by mouth 2 (two)  times daily. 02/02/19   [provider]  nitrofurantoin, macrocrystal-monohydrate, (MACROBID) 100 MG capsule Take 1 capsule (100 mg total) by mouth 2 (two) times daily. X 7 days Patient not taking: Reported on 12/09/7033 0/09/38   Delora Fuel, MD  permethrin (ELIMITE) 5 % cream Apply from the neck down at night, once, and wash in the morning x 1 application 07/18/27   Charlann Lange, PA-C  venlafaxine XR (EFFEXOR-XR) 75 MG 24 hr capsule Take 1 capsule (75 mg total) by mouth daily with breakfast. Patient not taking: Reported on 01/30/2019 01/03/18 02/08/19  Orma Flaming, MD    No Known Allergies  Social History   Tobacco Use  . Smoking status: Current Every Day Smoker    Packs/day: 1.00    Types: Cigarettes  . Smokeless tobacco: Never Used  Substance Use Topics  . Alcohol use: Yes    Alcohol/week: 1.0 - 2.0 standard drinks    Types: 1 - 2 Standard drinks or equivalent per week  . Drug use: Never    Family History  Problem Relation Age of Onset  . Arthritis Mother   . Asthma Mother   . Cancer Mother        liver  . Depression Mother   . Drug abuse Mother   .  Early death Mother   . Drug abuse Father   . Heart attack Father   . Heart disease Father   . Kidney disease Father   . Stroke Father   . Stroke Maternal Grandmother     Review of Systems  Constitutional: negative for fevers and chills Respiratory: positive for SOB; negative for hemoptysis, no significant cough Gastrointestinal: negative for nausea and diarrhea Integument/breast: negative for skin lesion(s) All other systems reviewed and are negative    Constitutional: in no apparent distress  Vitals:   02/19/19 1442  BP: 115/80  Pulse: (!) 106  Temp: 98.3 F (36.8 C)   EYES: anicteric Cardiovascular: Cor RRR Respiratory: CTA B; normal respiratory effort GI: Bowel sounds are normal, liver is not enlarged, spleen is not enlarged Musculoskeletal: no pedal edema noted Skin: multiple small erythematous  papules, back with scratched areas at site of papules.  Hematologic: no cervical lad Psych: poor eye contact  Labs: Lab Results  Component Value Date   WBC 10.4 02/17/2019   HGB 15.8 (H) 02/17/2019   HCT 46.4 (H) 02/17/2019   MCV 89.7 02/17/2019   PLT 200.0 02/17/2019    Lab Results  Component Value Date   CREATININE 0.73 02/17/2019   BUN 16 02/17/2019   NA 140 02/17/2019   K 4.0 02/17/2019   CL 104 02/17/2019   CO2 25 02/17/2019    Lab Results  Component Value Date   ALT 9 02/17/2019   AST 11 02/17/2019   ALKPHOS 70 02/17/2019   BILITOT 0.4 02/17/2019     Assessment: skin lesions.  I educated her that leishmaniasis is not in [place] and her lesions are not c/w cutaneous leishmaniasis, even early lesions, particularly 1 month out, they would have evolved more.  Additionally would be unusual to have multiple to this degree, though she is convinced that it is leishmaniasis.  Could be scabies, particularly since the rest of the family has them.  Samples that she brought in appear as skin cells and this was discussed with her.  I am unable to see any parasites, at least with the naked eye.  Plan: 1) send skin cells into pathology 2) sputum for O and P She will be informed of the results If negative for any diagnosis, could consider skin punch biopsy  Thanks for consultation  ADDENDUM 02/24/2019: derm path from scratched off skin cells c/w an arthropod.  Path suggests a tick but clinically with multiple lesions and dug in would be scabies/lice.  Since she has tried treatment with permethrin, I will prescribe her ivermectin x 1 dose.  This can be repeated after 2 weeks if needed.  Patient to be informed.

## 2019-02-21 ENCOUNTER — Telehealth: Payer: Self-pay | Admitting: Family Medicine

## 2019-02-21 ENCOUNTER — Encounter: Payer: Self-pay | Admitting: Family Medicine

## 2019-02-21 NOTE — Telephone Encounter (Signed)
Pt states she got a call this afternoon from Dr Rogers Blocker would like to proceed with the biopsy, and expressed desire to have done today.  Advised I would have someone call her back to discuss.

## 2019-02-21 NOTE — Telephone Encounter (Signed)
Spoke to patient and advised that we cannot see her today for biopsy; would need to be next week some time.  Also explained that should she choose to come in to see [place], she would be considered self pay and that biopsy would be very expensive as self pay.  I offered to give her the # to [place] but she declined.    She states that she is in so much pain and has so much anxiety about this issue that she "is ready to back to the ED to have them do it" I advised that this decision was entirely up to her and if she was in that much pain/distress then she should go back to ED.  Patient also stated that she does not have Medicaid but is in the process of applying for it.  She will most likely go back to ED to address biopsy.

## 2019-02-21 NOTE — Telephone Encounter (Signed)
Let her know we can not just get her in today. im leaving today. Also, we normally do not take medicaid which I forgot she switched to this. For cost savings, it may be better for her to [facility] the [facility], but if she wants to pay as a self pay she can come here since we do not take medicaid. Will have to be next week.

## 2019-02-24 MED ORDER — IVERMECTIN 3 MG PO TABS: 200.0000 ug/kg | ORAL_TABLET | Freq: Once | ORAL | 0 refills | Status: AC

## 2019-02-24 NOTE — Addendum Note (Signed)
Addended by: Thayer Headings on: 02/24/2019 12:09 PM   Modules accepted: Orders

## 2019-02-26 LAB — OVA AND PARASITE EXAMINATION
CONCENTRATE RESULT:: NONE SEEN
MICRO NUMBER:: 765375
SPECIMEN QUALITY:: ADEQUATE

## 2019-03-04 ENCOUNTER — Ambulatory Visit: Payer: Medicaid Other | Admitting: Internal Medicine

## 2019-03-05 ENCOUNTER — Ambulatory Visit: Payer: Medicaid Other | Admitting: Internal Medicine

## 2019-03-19 ENCOUNTER — Encounter: Payer: Self-pay | Admitting: Family Medicine

## 2019-03-20 NOTE — Telephone Encounter (Signed)
Please see the patient message.

## 2019-03-21 ENCOUNTER — Emergency Department (HOSPITAL_COMMUNITY)
Admission: EM | Admit: 2019-03-21 | Discharge: 2019-03-22 | Disposition: A | Discharge status: Home / Self Care | Payer: Self-pay | Attending: Emergency Medicine | Admitting: Emergency Medicine

## 2019-03-21 ENCOUNTER — Emergency Department (HOSPITAL_COMMUNITY)
Admission: EM | Admit: 2019-03-21 | Discharge: 2019-03-21 | Discharge status: Against Medical Advice | Payer: Medicaid Other | Attending: Emergency Medicine | Admitting: Emergency Medicine

## 2019-03-21 ENCOUNTER — Encounter (HOSPITAL_COMMUNITY): Payer: Self-pay | Admitting: Emergency Medicine

## 2019-03-21 ENCOUNTER — Other Ambulatory Visit: Payer: Self-pay

## 2019-03-21 DIAGNOSIS — J45909 Unspecified asthma, uncomplicated: Secondary | ICD-10-CM | POA: Insufficient documentation

## 2019-03-21 DIAGNOSIS — F17210 Nicotine dependence, cigarettes, uncomplicated: Secondary | ICD-10-CM | POA: Insufficient documentation

## 2019-03-21 DIAGNOSIS — L03116 Cellulitis of left lower limb: Secondary | ICD-10-CM | POA: Insufficient documentation

## 2019-03-21 DIAGNOSIS — Z5321 Procedure and treatment not carried out due to patient leaving prior to being seen by health care provider: Secondary | ICD-10-CM | POA: Insufficient documentation

## 2019-03-21 DIAGNOSIS — Z79899 Other long term (current) drug therapy: Secondary | ICD-10-CM | POA: Insufficient documentation

## 2019-03-21 MED ORDER — CEPHALEXIN 500 MG PO CAPS: 500.0000 mg | ORAL_CAPSULE | Freq: Four times a day (QID) | ORAL | 0 refills | Status: AC

## 2019-03-21 NOTE — Discharge Instructions (Signed)
Please follow-up with the referral you received previously for dermatology.  Please read the following testing.  Take the Keflex antibiotic prescribed.  Return to the ED if develop fever, chills, worsening pain, worsening cellulitis, or other new symptoms.

## 2019-03-21 NOTE — ED Triage Notes (Signed)
Pt c/o sores on L leg that will not heal, pt states she has had them since July.

## 2019-03-21 NOTE — ED Provider Notes (Signed)
[facility] DEPT Provider Note   CSN: [id number] Arrival date & time: 03/21/19  1746     History   Chief Complaint Chief Complaint  Patient presents with  . Left Leg Sores    HPI Kelli Washington is a 38 y.o. female with past medical history of IV drug abuse presents to the ER complaining of ongoing cutaneous sores.  Patient states that she was first diagnosed with folliculitis in early July after being stung by an insect on her upper back.  She reports that over the course next couple months, she developed sores on her right arm, then right thigh, and now she has sores on her left thigh.  She states that she has been evaluated at multiple emergency departments and feels as though her concerns are being dismissed.  She reports that her boyfriend has experienced similar skin lesions.  She also reports that she has recently developed hemorrhoids and believes that she has seen parasites in her feces and tampon.  She understands that the word "parasite" makes her unreliable, but maintains her convictions.  She also recently has developed a red sore on her right pinky finger.  She has not tried warm soaks.  Patient denies any recent recent illness, fever, chills, nausea, vomiting, or any other symptoms.  She states that she has not been picking her wounds and that they are not itchy.     HPI  Past Medical History:  Diagnosis Date  . Anxiety   . Arthritis   . Asthma   . Depression   . Dysmenorrhea   . Fibromyalgia 2016  . Vaginal delivery 1999,2002,2010,2013    Patient Active Problem List   Diagnosis Date Noted  . Skin lesion of back 02/19/2019  . Acute gallstone pancreatitis 01/12/2018  . Leukocytosis 01/12/2018  . Elevated LFTs 01/12/2018  . Fibromyalgia 01/03/2018    Past Surgical History:  Procedure Laterality Date  . CERVICAL BIOPSY  W/ LOOP ELECTRODE EXCISION    . CHOLECYSTECTOMY N/A 01/13/2018   Procedure: LAPAROSCOPIC CHOLECYSTECTOMY WITH  INTRAOPERATIVE CHOLANGIOGRAM;  Surgeon: Clovis Riley, MD;  Location: [facility];  Service: General;  Laterality: N/A;  . COLPOSCOPY    . TUBAL LIGATION    . WISDOM TOOTH EXTRACTION  2006     OB History    Gravida  4   Para  4   Term  4   Preterm      AB      Living  4     SAB      TAB      Ectopic      Multiple      Live Births  4            Home Medications    Prior to Admission medications   Medication Sig Start Date End Date Taking? Authorizing Provider  albuterol (VENTOLIN HFA) 108 (90 Base) MCG/ACT inhaler Inhale 1-2 puffs into the lungs every 6 (six) hours as needed for wheezing or shortness of breath.   Yes [provider]  ibuprofen (ADVIL) 200 MG tablet Take 600 mg by mouth every 6 (six) hours as needed for moderate pain.   Yes [provider]  lansoprazole (PREVACID) 15 MG capsule Take 15 mg by mouth daily.   Yes [provider]  cephALEXin (KEFLEX) 500 MG capsule Take 1 capsule (500 mg total) by mouth 4 (four) times daily for 7 days. 03/21/19 03/28/19  Corena Herter, PA-C  naproxen (NAPROSYN) 500 MG tablet  Take 1 tablet (500 mg total) by mouth 2 (two) times daily. Patient not taking: Reported on 03/21/2019 01/30/19   Dione BoozeGlick, David, MD  venlafaxine XR (EFFEXOR-XR) 75 MG 24 hr capsule Take 1 capsule (75 mg total) by mouth daily with breakfast. Patient not taking: Reported on 01/30/2019 01/03/18 02/08/19  Orland MustardWolfe, Allison, MD    Family History Family History  Problem Relation Age of Onset  . Arthritis Mother   . Asthma Mother   . Cancer Mother        liver  . Depression Mother   . Drug abuse Mother   . Early death Mother   . Drug abuse Father   . Heart attack Father   . Heart disease Father   . Kidney disease Father   . Stroke Father   . Stroke Maternal Grandmother     Social History Social History   Tobacco Use  . Smoking status: Current Every Day Smoker    Packs/day: 1.00    Types: Cigarettes  . Smokeless  tobacco: Never Used  Substance Use Topics  . Alcohol use: Yes    Alcohol/week: 1.0 - 2.0 standard drinks    Types: 1 - 2 Standard drinks or equivalent per week  . Drug use: Never     Allergies   Patient has no known allergies.   Review of Systems Review of Systems Ten systems are reviewed and are negative for acute change except as noted in the HPI   Physical Exam Updated Vital Signs BP 118/77   Pulse 97   Temp 98.7 F (37.1 C) (Oral)   Resp 18   LMP 03/19/2019   SpO2 99%   Physical Exam Constitutional:      Appearance: Normal appearance.  HENT:     Head: Normocephalic and atraumatic.  Eyes:     General: No scleral icterus.    Conjunctiva/sclera: Conjunctivae normal.  Cardiovascular:     Rate and Rhythm: Normal rate and regular rhythm.  Pulmonary:     Effort: Pulmonary effort is normal.     Breath sounds: Normal breath sounds.  Skin:    Comments: Numerous lesions diffusely in various stages of healing.  2 lesions on her left thigh have surrounding erythema, non-fluctuant.  Mildly tender to palpation.  Not indurated.  Not swollen.  No evidence of excoriation.  Erythematous swollen, nonfluctuant lesion on paronychia right fifth finger.  Neurological:     Mental Status: She is alert.     GCS: GCS eye subscore is 4. GCS verbal subscore is 5. GCS motor subscore is 6.  Psychiatric:        Mood and Affect: Mood normal.        Behavior: Behavior normal.      ED Treatments / Results  Labs (all labs ordered are listed, but only abnormal results are displayed) Labs Reviewed - No data to display  EKG None  Radiology No results found.  Procedures Procedures (including critical care time)  Medications Ordered in ED Medications - No data to display   Initial Impression / Assessment and Plan / ED Course  I have reviewed the triage vital signs and the nursing notes.  Pertinent labs & imaging results that were available during my care of the patient were  reviewed by me and considered in my medical decision making (see chart for details).       Patient has been evaluated 6 times in the past 6 weeks for these symptoms.  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 She did not exhibit any drug-seeking behavior on my exam and her thought processes did not seem overtly disturbed. Two lesions on her left thigh appear to be developing a surrounding cellulitis and will want to treat with keflex.  Patient appeared frustrated because she cannot afford to see specialists and that her PCP has largely ignored her concerns. She understands that the emergency department is not the ideal location for evaluation and work-up of her condition, but for like there was no other option for her.  No evidence of abscesses that require intervention or drainage.  Vital signs stable. Lesions on back, right thigh, and right arm appear to have healed well.  Do not have a Derm referral for her, but she did receive one from a previous encounter.  Encouraged to follow-up and schedule an appointment.  She is aware and understands there is nothing emergent and that a more comprehensive evaluation will have to be done by the specialist. She is understanding and agreeable to plan.   Final Clinical Impressions(s) / ED Diagnoses   Final diagnoses:  Cellulitis of left lower extremity    ED Discharge Orders         Ordered    cephALEXin (KEFLEX) 500 MG capsule  4 times daily     03/21/19 2344           Lorelee New, PA-C 03/22/19 0144    Vanetta Mulders, MD 03/31/19 [phone]

## 2019-03-21 NOTE — ED Triage Notes (Signed)
Patient reports persistent multiple skin abscesses at left thigh for several weeks with drainage , no fever or chills .

## 2019-03-22 NOTE — ED Notes (Signed)
Pt verbalized understanding of verbal and written d/c instructions. Ambulatory to POV for d/c

## 2019-04-03 ENCOUNTER — Telehealth: Payer: Self-pay

## 2019-04-03 NOTE — Telephone Encounter (Signed)
Based on incoming email request from Marlon Pel from [facility].  OV notes and labs from 8/10 visit w/Dr. Rogers Blocker as well as 8/12 OV notes and labs with Dr. Linus Salmons in [place] were faxed to [facility] attention.

## 2019-07-18 ENCOUNTER — Emergency Department (HOSPITAL_COMMUNITY)
Admission: EM | Admit: 2019-07-18 | Discharge: 2019-07-18 | Disposition: A | Discharge status: Home / Self Care | Payer: Medicaid Other | Attending: Emergency Medicine | Admitting: Emergency Medicine

## 2019-07-18 ENCOUNTER — Encounter (HOSPITAL_COMMUNITY): Payer: Self-pay | Admitting: *Deleted

## 2019-07-18 ENCOUNTER — Other Ambulatory Visit: Payer: Self-pay

## 2019-07-18 DIAGNOSIS — F17210 Nicotine dependence, cigarettes, uncomplicated: Secondary | ICD-10-CM | POA: Insufficient documentation

## 2019-07-18 DIAGNOSIS — R070 Pain in throat: Secondary | ICD-10-CM | POA: Diagnosis not present

## 2019-07-18 DIAGNOSIS — J45909 Unspecified asthma, uncomplicated: Secondary | ICD-10-CM | POA: Insufficient documentation

## 2019-07-18 MED ORDER — CLOTRIMAZOLE 10 MG MT TROC: 10.0000 mg | Freq: Every day | OROMUCOSAL | 0 refills | Status: DC

## 2019-07-18 NOTE — Discharge Instructions (Addendum)
You were seen in the ER for throat discomfort  Throat exam today was ok, did not see signs of strep or thrush   Symptoms may be from a virus but you have no other symptoms of COVID or any other significant infection  I recommend warm liquids, salt water gargles, ibuprofen/acetaminophen and soft pureed diet until symptoms improve  Clotrimazole is a medicine that helps with fungal infection of the throat, use this as needed

## 2019-07-18 NOTE — ED Provider Notes (Signed)
[facility] EMERGENCY DEPARTMENT Provider Note   CSN: [id number] Arrival date & time: 07/18/19  1937     History No chief complaint on file.   Kelli Washington is a 39 y.o. female presents to ER for evaluation of throat discomfort.  Onset one week ago.  States she first felt like the back of her tongue felt "hairy", looked in the mirror and noticed white spots on her throat.  She has irritated, raw discomfort in the throat and occasional pain with swallowing that is mild.  Feels like her voice is raspy. She is concerned about thrush because states she has had athlete's feet for a while.  Had thrush while she was breast feeding her children several years ago.  Reports history of fibromyalgia but no other medical problems or immunosuppressant medicines.  Denies fever, chills, nasal congestion, rhinorrhea, cough, CP, vomiting, diarrhea, abdominal pain, myalgias, loss of taste or smell.  No exposure to COVID. No exposure to strep. No interventions.   HPI     Past Medical History:  Diagnosis Date  . Anxiety   . Arthritis   . Asthma   . Depression   . Dysmenorrhea   . Fibromyalgia 2016  . Vaginal delivery 1999,2002,2010,2013    Patient Active Problem List   Diagnosis Date Noted  . Skin lesion of back 02/19/2019  . Acute gallstone pancreatitis 01/12/2018  . Leukocytosis 01/12/2018  . Elevated LFTs 01/12/2018  . Fibromyalgia 01/03/2018    Past Surgical History:  Procedure Laterality Date  . CERVICAL BIOPSY  W/ LOOP ELECTRODE EXCISION    . CHOLECYSTECTOMY N/A 01/13/2018   Procedure: LAPAROSCOPIC CHOLECYSTECTOMY WITH INTRAOPERATIVE CHOLANGIOGRAM;  Surgeon: Clovis Riley, MD;  Location: [facility];  Service: General;  Laterality: N/A;  . COLPOSCOPY    . TUBAL LIGATION    . WISDOM TOOTH EXTRACTION  2006     OB History    Gravida  4   Para  4   Term  4   Preterm      AB      Living  4     SAB      TAB      Ectopic      Multiple      Live Births   4           Family History  Problem Relation Age of Onset  . Arthritis Mother   . Asthma Mother   . Cancer Mother        liver  . Depression Mother   . Drug abuse Mother   . Early death Mother   . Drug abuse Father   . Heart attack Father   . Heart disease Father   . Kidney disease Father   . Stroke Father   . Stroke Maternal Grandmother     Social History   Tobacco Use  . Smoking status: Current Every Day Smoker    Packs/day: 1.00    Types: Cigarettes  . Smokeless tobacco: Never Used  Substance Use Topics  . Alcohol use: Yes    Alcohol/week: 1.0 - 2.0 standard drinks    Types: 1 - 2 Standard drinks or equivalent per week  . Drug use: Never    Home Medications Prior to Admission medications   Medication Sig Start Date End Date Taking? Authorizing Provider  albuterol (VENTOLIN HFA) 108 (90 Base) MCG/ACT inhaler Inhale 1-2 puffs into the lungs every 6 (six) hours as needed for wheezing or shortness of breath.  Yes [provider]  ibuprofen (ADVIL) 200 MG tablet Take 600 mg by mouth every 6 (six) hours as needed for moderate pain.   Yes [provider]  miconazole (MICOTIN) 2 % cream Apply 1 application topically 2 (two) times daily as needed (athlete's foot).   Yes [provider]  clotrimazole (MYCELEX) 10 MG troche Take 1 tablet (10 mg total) by mouth 5 (five) times daily. 07/18/19   Liberty Handy, PA-C  naproxen (NAPROSYN) 500 MG tablet Take 1 tablet (500 mg total) by mouth 2 (two) times daily. Patient not taking: Reported on 03/21/2019 01/30/19   Dione Booze, MD  venlafaxine XR (EFFEXOR-XR) 75 MG 24 hr capsule Take 1 capsule (75 mg total) by mouth daily with breakfast. Patient not taking: Reported on 01/30/2019 01/03/18 02/08/19  Orland Mustard, MD    Allergies    Egg [eggs or egg-derived products]  Review of Systems   Review of Systems  HENT:       Throat discomfort   All other systems reviewed and are negative.   Physical  Exam Updated Vital Signs BP 115/81 (BP Location: Right Arm)   Pulse 88   Temp 98.4 F (36.9 C) (Oral)   Resp 14   Ht 5\' 5"  (1.651 m)   Wt 63.5 kg   SpO2 100%   BMI 23.30 kg/m   Physical Exam Vitals and nursing note reviewed.  Constitutional:      Appearance: She is well-developed.  HENT:     Head: Normocephalic.     Nose: Nose normal.     Mouth/Throat:     Pharynx: Posterior oropharyngeal erythema present.     Comments: Very mild erythema along bilateral tonsilar pillars.  Tonsils visualized normal. No lesions or abnormalities of palates, uvula, pharyngeal wall or tongue. Normal SL space. No trismus. Tolerating secretions. No drooling. Normal phonation. Normal tongue movements.  Eyes:     General: Lids are normal.  Neck:     Comments: No cervical lymphadenopathy. No AL neck edema  Cardiovascular:     Rate and Rhythm: Normal rate.  Pulmonary:     Effort: Pulmonary effort is normal. No respiratory distress.  Musculoskeletal:        General: Normal range of motion.     Cervical back: Normal range of motion.  Neurological:     Mental Status: She is alert.  Psychiatric:        Behavior: Behavior normal.     ED Results / Procedures / Treatments   Labs (all labs ordered are listed, but only abnormal results are displayed) Labs Reviewed - No data to display  EKG None  Radiology No results found.  Procedures Procedures (including critical care time)  Medications Ordered in ED Medications - No data to display  ED Course  I have reviewed the triage vital signs and the nursing notes.  Pertinent labs & imaging results that were available during my care of the patient were reviewed by me and considered in my medical decision making (see chart for details).    MDM Rules/Calculators/A&P                      Ddx includes viral pharyngitis vs laryngitis.  No signs of strep pharyngitis clinically.  Other than mild erythema no convincing findings to suggest thrush.  No  other concerning mucosal lesions. No signs of PTA or significant deep neck infection.  She has no other COVID symptoms.  Discussed with patient symptoms likely due  to inflammation vs viral process.  She is very concerned about thrush but clinically this is unlikely. Explained this to patient given immunocompetent status low suspicion for this. But she states she has fibromyalgia which is an "autoimmune disease".  No medical indication for further emergent work up.  Given patient concern will dc with symptomatic management and clotrimazole troches.  Return precautions given. She is comfortable with plan.  Final Clinical Impression(s) / ED Diagnoses Final diagnoses:  Throat discomfort    Rx / DC Orders ED Discharge Orders         Ordered    clotrimazole (MYCELEX) 10 MG troche  5 times daily     07/18/19 2111           Jerrell Mylar 07/18/19 2157    Rolan Bucco, MD 07/18/19 2220

## 2019-07-18 NOTE — ED Triage Notes (Signed)
Pt says that for about a week she has had white patches on her throat and sometimes mouth, her tongue feels "hairy" and having problems with athletes feet.

## 2019-09-19 ENCOUNTER — Encounter (HOSPITAL_COMMUNITY): Payer: Self-pay

## 2019-09-19 ENCOUNTER — Other Ambulatory Visit: Payer: Self-pay

## 2019-09-19 DIAGNOSIS — Z813 Family history of other psychoactive substance abuse and dependence: Secondary | ICD-10-CM

## 2019-09-19 DIAGNOSIS — N136 Pyonephrosis: Secondary | ICD-10-CM | POA: Diagnosis present

## 2019-09-19 DIAGNOSIS — F329 Major depressive disorder, single episode, unspecified: Secondary | ICD-10-CM | POA: Diagnosis present

## 2019-09-19 DIAGNOSIS — Z825 Family history of asthma and other chronic lower respiratory diseases: Secondary | ICD-10-CM

## 2019-09-19 DIAGNOSIS — Z841 Family history of disorders of kidney and ureter: Secondary | ICD-10-CM

## 2019-09-19 DIAGNOSIS — Z7289 Other problems related to lifestyle: Secondary | ICD-10-CM

## 2019-09-19 DIAGNOSIS — Z91012 Allergy to eggs: Secondary | ICD-10-CM

## 2019-09-19 DIAGNOSIS — F17210 Nicotine dependence, cigarettes, uncomplicated: Secondary | ICD-10-CM | POA: Diagnosis present

## 2019-09-19 DIAGNOSIS — Z9049 Acquired absence of other specified parts of digestive tract: Secondary | ICD-10-CM

## 2019-09-19 DIAGNOSIS — F1290 Cannabis use, unspecified, uncomplicated: Secondary | ICD-10-CM | POA: Diagnosis present

## 2019-09-19 DIAGNOSIS — Z20822 Contact with and (suspected) exposure to covid-19: Secondary | ICD-10-CM | POA: Diagnosis present

## 2019-09-19 DIAGNOSIS — Z823 Family history of stroke: Secondary | ICD-10-CM

## 2019-09-19 DIAGNOSIS — N151 Renal and perinephric abscess: Principal | ICD-10-CM | POA: Diagnosis present

## 2019-09-19 DIAGNOSIS — M797 Fibromyalgia: Secondary | ICD-10-CM | POA: Diagnosis present

## 2019-09-19 DIAGNOSIS — Z8249 Family history of ischemic heart disease and other diseases of the circulatory system: Secondary | ICD-10-CM

## 2019-09-19 DIAGNOSIS — F1590 Other stimulant use, unspecified, uncomplicated: Secondary | ICD-10-CM | POA: Diagnosis present

## 2019-09-19 DIAGNOSIS — R7881 Bacteremia: Secondary | ICD-10-CM | POA: Diagnosis present

## 2019-09-19 DIAGNOSIS — F419 Anxiety disorder, unspecified: Secondary | ICD-10-CM | POA: Diagnosis present

## 2019-09-19 DIAGNOSIS — Z87442 Personal history of urinary calculi: Secondary | ICD-10-CM

## 2019-09-19 DIAGNOSIS — B961 Klebsiella pneumoniae [K. pneumoniae] as the cause of diseases classified elsewhere: Secondary | ICD-10-CM | POA: Diagnosis present

## 2019-09-19 DIAGNOSIS — Z818 Family history of other mental and behavioral disorders: Secondary | ICD-10-CM
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
## 2019-09-20 ENCOUNTER — Inpatient Hospital Stay (HOSPITAL_COMMUNITY): Payer: Medicaid Other | Admitting: Anesthesiology

## 2019-09-20 ENCOUNTER — Inpatient Hospital Stay (HOSPITAL_COMMUNITY)
Admission: EM | Admit: 2019-09-20 | Discharge: 2019-09-23 | DRG: 660 | Disposition: A | Discharge status: Home / Self Care | Payer: Medicaid Other | Attending: Urology | Admitting: Urology

## 2019-09-20 ENCOUNTER — Encounter (HOSPITAL_COMMUNITY)
Admission: EM | Disposition: A | Discharge status: Home / Self Care | Payer: Self-pay | Source: Home / Self Care | Attending: Urology

## 2019-09-20 ENCOUNTER — Encounter (HOSPITAL_COMMUNITY): Payer: Self-pay

## 2019-09-20 ENCOUNTER — Emergency Department (HOSPITAL_COMMUNITY): Payer: Medicaid Other

## 2019-09-20 ENCOUNTER — Inpatient Hospital Stay (HOSPITAL_COMMUNITY): Payer: Medicaid Other

## 2019-09-20 DIAGNOSIS — M545 Low back pain, unspecified: Secondary | ICD-10-CM

## 2019-09-20 DIAGNOSIS — F1590 Other stimulant use, unspecified, uncomplicated: Secondary | ICD-10-CM | POA: Diagnosis present

## 2019-09-20 DIAGNOSIS — F419 Anxiety disorder, unspecified: Secondary | ICD-10-CM | POA: Diagnosis present

## 2019-09-20 DIAGNOSIS — Z841 Family history of disorders of kidney and ureter: Secondary | ICD-10-CM | POA: Diagnosis not present

## 2019-09-20 DIAGNOSIS — N136 Pyonephrosis: Secondary | ICD-10-CM | POA: Diagnosis present

## 2019-09-20 DIAGNOSIS — R7881 Bacteremia: Secondary | ICD-10-CM | POA: Diagnosis present

## 2019-09-20 DIAGNOSIS — Z813 Family history of other psychoactive substance abuse and dependence: Secondary | ICD-10-CM | POA: Diagnosis not present

## 2019-09-20 DIAGNOSIS — Z87442 Personal history of urinary calculi: Secondary | ICD-10-CM | POA: Diagnosis not present

## 2019-09-20 DIAGNOSIS — Z818 Family history of other mental and behavioral disorders: Secondary | ICD-10-CM | POA: Diagnosis not present

## 2019-09-20 DIAGNOSIS — Z7289 Other problems related to lifestyle: Secondary | ICD-10-CM | POA: Diagnosis not present

## 2019-09-20 DIAGNOSIS — M797 Fibromyalgia: Secondary | ICD-10-CM | POA: Diagnosis present

## 2019-09-20 DIAGNOSIS — F17210 Nicotine dependence, cigarettes, uncomplicated: Secondary | ICD-10-CM | POA: Diagnosis present

## 2019-09-20 DIAGNOSIS — R109 Unspecified abdominal pain: Secondary | ICD-10-CM

## 2019-09-20 DIAGNOSIS — Z9049 Acquired absence of other specified parts of digestive tract: Secondary | ICD-10-CM | POA: Diagnosis not present

## 2019-09-20 DIAGNOSIS — Z91012 Allergy to eggs: Secondary | ICD-10-CM | POA: Diagnosis not present

## 2019-09-20 DIAGNOSIS — F1290 Cannabis use, unspecified, uncomplicated: Secondary | ICD-10-CM | POA: Diagnosis present

## 2019-09-20 DIAGNOSIS — Z823 Family history of stroke: Secondary | ICD-10-CM | POA: Diagnosis not present

## 2019-09-20 DIAGNOSIS — F329 Major depressive disorder, single episode, unspecified: Secondary | ICD-10-CM | POA: Diagnosis present

## 2019-09-20 DIAGNOSIS — N132 Hydronephrosis with renal and ureteral calculous obstruction: Secondary | ICD-10-CM

## 2019-09-20 DIAGNOSIS — Z8249 Family history of ischemic heart disease and other diseases of the circulatory system: Secondary | ICD-10-CM | POA: Diagnosis not present

## 2019-09-20 DIAGNOSIS — N151 Renal and perinephric abscess: Secondary | ICD-10-CM | POA: Diagnosis present

## 2019-09-20 DIAGNOSIS — B961 Klebsiella pneumoniae [K. pneumoniae] as the cause of diseases classified elsewhere: Secondary | ICD-10-CM | POA: Diagnosis present

## 2019-09-20 DIAGNOSIS — Z825 Family history of asthma and other chronic lower respiratory diseases: Secondary | ICD-10-CM | POA: Diagnosis not present

## 2019-09-20 DIAGNOSIS — N12 Tubulo-interstitial nephritis, not specified as acute or chronic: Secondary | ICD-10-CM

## 2019-09-20 DIAGNOSIS — Z20822 Contact with and (suspected) exposure to covid-19: Secondary | ICD-10-CM | POA: Diagnosis present

## 2019-09-20 DIAGNOSIS — N201 Calculus of ureter: Secondary | ICD-10-CM | POA: Diagnosis present

## 2019-09-20 HISTORY — PX: CYSTOSCOPY WITH STENT PLACEMENT: SHX5790

## 2019-09-20 LAB — CBC WITH DIFFERENTIAL/PLATELET
Abs Immature Granulocytes: 0.13 10*3/uL — ABNORMAL HIGH (ref 0.00–0.07)
Basophils Absolute: 0.1 10*3/uL (ref 0.0–0.1)
Basophils Relative: 0 %
Eosinophils Absolute: 0 10*3/uL (ref 0.0–0.5)
Eosinophils Relative: 0 %
HCT: 46.3 % — ABNORMAL HIGH (ref 36.0–46.0)
Hemoglobin: 15.5 g/dL — ABNORMAL HIGH (ref 12.0–15.0)
Immature Granulocytes: 1 %
Lymphocytes Relative: 3 %
Lymphs Abs: 0.8 10*3/uL (ref 0.7–4.0)
MCH: 30.4 pg (ref 26.0–34.0)
MCHC: 33.5 g/dL (ref 30.0–36.0)
MCV: 90.8 fL (ref 80.0–100.0)
Monocytes Absolute: 0.7 10*3/uL (ref 0.1–1.0)
Monocytes Relative: 3 %
Neutro Abs: 25 10*3/uL — ABNORMAL HIGH (ref 1.7–7.7)
Neutrophils Relative %: 93 %
Platelets: 189 10*3/uL (ref 150–400)
RBC: 5.1 MIL/uL (ref 3.87–5.11)
RDW: 13.1 % (ref 11.5–15.5)
WBC: 26.7 10*3/uL — ABNORMAL HIGH (ref 4.0–10.5)
nRBC: 0 % (ref 0.0–0.2)

## 2019-09-20 LAB — COMPREHENSIVE METABOLIC PANEL
ALT: 15 U/L (ref 0–44)
AST: 20 U/L (ref 15–41)
Albumin: 3.9 g/dL (ref 3.5–5.0)
Alkaline Phosphatase: 66 U/L (ref 38–126)
Anion gap: 10 (ref 5–15)
BUN: 15 mg/dL (ref 6–20)
CO2: 23 mmol/L (ref 22–32)
Calcium: 9 mg/dL (ref 8.9–10.3)
Chloride: 103 mmol/L (ref 98–111)
Creatinine, Ser: 0.82 mg/dL (ref 0.44–1.00)
GFR calc Af Amer: >60 mL/min (ref >60)
GFR calc non Af Amer: >60 mL/min (ref >60)
Glucose, Bld: 121 mg/dL — ABNORMAL HIGH (ref 70–99)
Potassium: 3.5 mmol/L (ref 3.5–5.1)
Sodium: 136 mmol/L (ref 135–145)
Total Bilirubin: 0.9 mg/dL (ref 0.3–1.2)
Total Protein: 7.3 g/dL (ref 6.5–8.1)

## 2019-09-20 LAB — RAPID URINE DRUG SCREEN, HOSP PERFORMED
Amphetamines: POSITIVE — AB
Barbiturates: NOT DETECTED
Benzodiazepines: NOT DETECTED
Cocaine: NOT DETECTED
Opiates: POSITIVE — AB
Tetrahydrocannabinol: POSITIVE — AB

## 2019-09-20 LAB — URINALYSIS, ROUTINE W REFLEX MICROSCOPIC
Bilirubin Urine: NEGATIVE
Glucose, UA: NEGATIVE mg/dL
Ketones, ur: NEGATIVE mg/dL
Nitrite: POSITIVE — AB
Protein, ur: NEGATIVE mg/dL
RBC / HPF: >50 RBC/hpf — ABNORMAL HIGH (ref 0–5)
Specific Gravity, Urine: >1.046 — ABNORMAL HIGH (ref 1.005–1.030)
WBC, UA: >50 WBC/hpf — ABNORMAL HIGH (ref 0–5)
pH: 6 (ref 5.0–8.0)

## 2019-09-20 LAB — APTT: aPTT: 29 seconds (ref 24–36)

## 2019-09-20 LAB — PROTIME-INR
INR: 1.1 (ref 0.8–1.2)
Prothrombin Time: 14 seconds (ref 11.4–15.2)

## 2019-09-20 LAB — RESPIRATORY PANEL BY RT PCR (FLU A&B, COVID)
Influenza A by PCR: NEGATIVE
Influenza B by PCR: NEGATIVE
SARS Coronavirus 2 by RT PCR: NEGATIVE

## 2019-09-20 LAB — PREGNANCY, URINE: Preg Test, Ur: NEGATIVE

## 2019-09-20 LAB — LACTIC ACID, PLASMA: Lactic Acid, Venous: 1.3 mmol/L (ref 0.5–1.9)

## 2019-09-20 SURGERY — CYSTOSCOPY, WITH STENT INSERTION
Anesthesia: General | Site: Ureter

## 2019-09-20 MED ORDER — SODIUM CHLORIDE 0.9 % IV SOLN
1000.0000 mL | INTRAVENOUS | Status: DC
  Administered 2019-09-20 – 2019-09-23 (×7): 1000 mL via INTRAVENOUS

## 2019-09-20 MED ORDER — DEXAMETHASONE SODIUM PHOSPHATE 10 MG/ML IJ SOLN
INTRAMUSCULAR | Status: AC
  Filled 2019-09-20: qty 1

## 2019-09-20 MED ORDER — FENTANYL CITRATE (PF) 100 MCG/2ML IJ SOLN
INTRAMUSCULAR | Status: AC
  Filled 2019-09-20: qty 2

## 2019-09-20 MED ORDER — SODIUM CHLORIDE 0.9 % IV SOLN
1.0000 g | INTRAVENOUS | Status: DC
  Filled 2019-09-20: qty 10

## 2019-09-20 MED ORDER — SODIUM CHLORIDE 0.9 % IV SOLN
1.0000 g | Freq: Once | INTRAVENOUS | Status: AC
  Administered 2019-09-20: 1 g via INTRAVENOUS
  Filled 2019-09-20: qty 10

## 2019-09-20 MED ORDER — LIDOCAINE 2% (20 MG/ML) 5 ML SYRINGE
INTRAMUSCULAR | Status: AC
  Filled 2019-09-20: qty 5

## 2019-09-20 MED ORDER — LIDOCAINE 2% (20 MG/ML) 5 ML SYRINGE
INTRAMUSCULAR | Status: DC | PRN
  Administered 2019-09-20: 100 mg via INTRAVENOUS

## 2019-09-20 MED ORDER — DEXAMETHASONE SODIUM PHOSPHATE 10 MG/ML IJ SOLN
INTRAMUSCULAR | Status: DC | PRN
  Administered 2019-09-20: 10 mg via INTRAVENOUS

## 2019-09-20 MED ORDER — FENTANYL CITRATE (PF) 100 MCG/2ML IJ SOLN
INTRAMUSCULAR | Status: DC | PRN
  Administered 2019-09-20: 50 ug via INTRAVENOUS

## 2019-09-20 MED ORDER — OXYBUTYNIN CHLORIDE 5 MG PO TABS: 5.0000 mg | ORAL_TABLET | Freq: Three times a day (TID) | ORAL | Status: DC | PRN

## 2019-09-20 MED ORDER — SODIUM CHLORIDE (PF) 0.9 % IJ SOLN
INTRAMUSCULAR | Status: AC
  Filled 2019-09-20: qty 50

## 2019-09-20 MED ORDER — ONDANSETRON HCL 4 MG/2ML IJ SOLN
INTRAMUSCULAR | Status: AC
  Filled 2019-09-20: qty 2

## 2019-09-20 MED ORDER — PROPOFOL 10 MG/ML IV BOLUS
INTRAVENOUS | Status: AC
  Filled 2019-09-20: qty 20

## 2019-09-20 MED ORDER — MORPHINE SULFATE (PF) 4 MG/ML IV SOLN
4.0000 mg | Freq: Once | INTRAVENOUS | Status: AC
  Administered 2019-09-20: 4 mg via INTRAVENOUS
  Filled 2019-09-20: qty 1

## 2019-09-20 MED ORDER — MIDAZOLAM HCL 2 MG/2ML IJ SOLN
INTRAMUSCULAR | Status: AC
  Filled 2019-09-20: qty 2

## 2019-09-20 MED ORDER — MIDAZOLAM HCL 5 MG/5ML IJ SOLN
INTRAMUSCULAR | Status: DC | PRN
  Administered 2019-09-20: 2 mg via INTRAVENOUS

## 2019-09-20 MED ORDER — FENTANYL CITRATE (PF) 100 MCG/2ML IJ SOLN: 25.0000 ug | INTRAMUSCULAR | Status: DC | PRN

## 2019-09-20 MED ORDER — ACETAMINOPHEN 325 MG PO TABS
650.0000 mg | ORAL_TABLET | Freq: Four times a day (QID) | ORAL | Status: DC | PRN
  Administered 2019-09-20 – 2019-09-23 (×6): 650 mg via ORAL
  Filled 2019-09-20 (×6): qty 2

## 2019-09-20 MED ORDER — PROPOFOL 10 MG/ML IV BOLUS
INTRAVENOUS | Status: DC | PRN
  Administered 2019-09-20: 160 mg via INTRAVENOUS

## 2019-09-20 MED ORDER — IOHEXOL 300 MG/ML  SOLN
INTRAMUSCULAR | Status: DC | PRN
  Administered 2019-09-20: 10 mL

## 2019-09-20 MED ORDER — ONDANSETRON HCL 4 MG/2ML IJ SOLN
4.0000 mg | Freq: Once | INTRAMUSCULAR | Status: AC
  Administered 2019-09-20: 4 mg via INTRAVENOUS
  Filled 2019-09-20: qty 2

## 2019-09-20 MED ORDER — LACTATED RINGERS IV SOLN: INTRAVENOUS | Status: DC | PRN

## 2019-09-20 MED ORDER — PHENAZOPYRIDINE HCL 200 MG PO TABS
200.0000 mg | ORAL_TABLET | Freq: Three times a day (TID) | ORAL | Status: AC
  Administered 2019-09-20 – 2019-09-22 (×5): 200 mg via ORAL
  Filled 2019-09-20 (×6): qty 1

## 2019-09-20 MED ORDER — ONDANSETRON HCL 4 MG PO TABS: 4.0000 mg | ORAL_TABLET | Freq: Three times a day (TID) | ORAL | Status: DC | PRN

## 2019-09-20 MED ORDER — ONDANSETRON HCL 4 MG/2ML IJ SOLN
INTRAMUSCULAR | Status: DC | PRN
  Administered 2019-09-20: 5 mg via INTRAVENOUS

## 2019-09-20 MED ORDER — TAMSULOSIN HCL 0.4 MG PO CAPS
0.4000 mg | ORAL_CAPSULE | Freq: Every day | ORAL | Status: DC
  Administered 2019-09-20 – 2019-09-23 (×4): 0.4 mg via ORAL
  Filled 2019-09-20 (×4): qty 1

## 2019-09-20 MED ORDER — SUCCINYLCHOLINE CHLORIDE 200 MG/10ML IV SOSY
PREFILLED_SYRINGE | INTRAVENOUS | Status: DC | PRN
  Administered 2019-09-20: 100 mg via INTRAVENOUS

## 2019-09-20 MED ORDER — IOHEXOL 300 MG/ML  SOLN
100.0000 mL | Freq: Once | INTRAMUSCULAR | Status: AC | PRN
  Administered 2019-09-20: 100 mL via INTRAVENOUS

## 2019-09-20 MED ORDER — OXYCODONE HCL 5 MG PO TABS
5.0000 mg | ORAL_TABLET | ORAL | Status: DC | PRN
  Administered 2019-09-21 – 2019-09-22 (×3): 5 mg via ORAL
  Filled 2019-09-20 (×3): qty 1

## 2019-09-20 MED ORDER — SODIUM CHLORIDE 0.9 % IR SOLN
Status: DC | PRN
  Administered 2019-09-20: 3000 mL

## 2019-09-20 MED ORDER — DOCUSATE SODIUM 100 MG PO CAPS
100.0000 mg | ORAL_CAPSULE | Freq: Two times a day (BID) | ORAL | Status: DC
  Administered 2019-09-20 – 2019-09-23 (×7): 100 mg via ORAL
  Filled 2019-09-20 (×7): qty 1

## 2019-09-20 SURGICAL SUPPLY — 14 items
BAG URO CATCHER STRL LF (MISCELLANEOUS) ×3 IMPLANT
CATH INTERMIT  6FR 70CM (CATHETERS) ×3 IMPLANT
CLOTH BEACON ORANGE TIMEOUT ST (SAFETY) ×3 IMPLANT
GLOVE BIO SURGEON STRL SZ 6 (GLOVE) ×3 IMPLANT
GOWN STRL REUS W/ TWL LRG LVL3 (GOWN DISPOSABLE) ×1 IMPLANT
GOWN STRL REUS W/TWL LRG LVL3 (GOWN DISPOSABLE) ×8 IMPLANT
GUIDEWIRE STR DUAL SENSOR (WIRE) ×3 IMPLANT
KIT TURNOVER KIT A (KITS) IMPLANT
MANIFOLD NEPTUNE II (INSTRUMENTS) ×3 IMPLANT
PACK CYSTO (CUSTOM PROCEDURE TRAY) ×3 IMPLANT
STENT URET 6FRX26 CONTOUR (STENTS) ×3 IMPLANT
TUBING CONNECTING 10 (TUBING) ×2 IMPLANT
TUBING CONNECTING 10' (TUBING) ×1
TUBING UROLOGY SET (TUBING) ×3 IMPLANT

## 2019-09-20 NOTE — Anesthesia Preprocedure Evaluation (Addendum)
Anesthesia Evaluation  Patient identified by MRN, date of birth, ID band Patient awake    Reviewed: Allergy & Precautions, NPO status , Patient's Chart, lab work & pertinent test results  Airway Mallampati: II  TM Distance: >3 FB     Dental   Pulmonary Current Smoker,    breath sounds clear to auscultation       Cardiovascular negative cardio ROS   Rhythm:Regular Rate:Normal     Neuro/Psych    GI/Hepatic negative GI ROS, Neg liver ROS,   Endo/Other  negative endocrine ROS  Renal/GU negative Renal ROS     Musculoskeletal   Abdominal   Peds  Hematology   Anesthesia Other Findings   Reproductive/Obstetrics                            Anesthesia Physical Anesthesia Plan  ASA: II  Anesthesia Plan: General   Post-op Pain Management:    Induction:   PONV Risk Score and Plan: 2 and Ondansetron, Dexamethasone and Midazolam  Airway Management Planned:   Additional Equipment:   Intra-op Plan:   Post-operative Plan: Extubation in OR  Informed Consent: I have reviewed the patients History and Physical, chart, labs and discussed the procedure including the risks, benefits and alternatives for the proposed anesthesia with the patient or authorized representative who has indicated his/her understanding and acceptance.     Dental advisory given  Plan Discussed with: CRNA and Anesthesiologist  Anesthesia Plan Comments:         Anesthesia Quick Evaluation

## 2019-09-20 NOTE — Op Note (Signed)
Preoperative diagnosis:  1. Left mid ureteral calculus with hydronephrosis and developing renal abscess  Postoperative diagnosis:  1. Same  Procedure:  1. Cystoscopy 2. left ureteral stent placement 3. left retrograde pyelography with interpretation   Surgeon: Kasandra Knudsen, MD  Anesthesia: General  Complications: None  Intraoperative findings:  left retrograde pyelography demonstrated a filling defect within the left ureter consistent with the patient's known calculus without other abnormalities.  EBL: Minimal  Specimens: None  Indication: Kelli Washington is a 39 y.o. patient who presented to the ER with left flank pain found to have a 5 mm mild the obstructing left ureteral calculus associated with infection and leukocytosis to 27.  CT scan also showed possible evolving abscess.. After reviewing the management options for treatment, he elected to proceed with the above surgical procedure(s). We have discussed the potential benefits and risks of the procedure, side effects of the proposed treatment, the likelihood of the patient achieving the goals of the procedure, and any potential problems that might occur during the procedure or recuperation. Informed consent has been obtained.  Description of procedure:  The patient was taken to the operating room and general anesthesia was induced.  The patient was placed in the dorsal lithotomy position, prepped and draped in the usual sterile fashion, and preoperative antibiotics were administered. A preoperative time-out was performed.   Cystourethroscopy was performed.  The patient's urethra was examined and was normal. The bladder was then systematically examined in its entirety. There was no evidence for any bladder tumors, stones, or other mucosal pathology.    Attention then turned to the leftureteral orifice and a ureteral catheter was used to intubate the ureteral orifice.  Omnipaque contrast was injected through the ureteral  catheter and a retrograde pyelogram was performed with findings as dictated above.  A 0.38 sensor guidewire was then advanced up the left ureter into the renal pelvis under fluoroscopic guidance.  The wire was then backloaded through the cystoscope and a ureteral stent was advance over the wire using Seldinger technique.  The stent was positioned appropriately under fluoroscopic and cystoscopic guidance.  The wire was then removed with an adequate stent curl noted in the renal pelvis as well as in the bladder.  The bladder was then emptied and the procedure ended.  The patient appeared to tolerate the procedure well and without complications.  The patient was able to be awakened and transferred to the recovery unit in satisfactory condition.    Kasandra Knudsen, M.D.

## 2019-09-20 NOTE — Anesthesia Postprocedure Evaluation (Signed)
Anesthesia Post Note  Patient: Kelli Washington  Procedure(s) Performed: CYSTOSCOPY WITH STENT PLACEMENT.  Left Retrograde Pyelogram (N/A Ureter)     Patient location during evaluation: PACU Anesthesia Type: General Level of consciousness: awake Pain management: pain level controlled Vital Signs Assessment: post-procedure vital signs reviewed and stable Respiratory status: spontaneous breathing Cardiovascular status: stable Postop Assessment: no apparent nausea or vomiting Anesthetic complications: no    Last Vitals:  Vitals:   09/20/19 1000 09/20/19 1015  BP: 103/64 105/69  Pulse: (!) 114 (!) 112  Resp: 17 17  Temp:    SpO2: 100% 97%    Last Pain:  Vitals:   09/20/19 1000  TempSrc:   PainSc: 0-No pain                 [name]
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
## 2019-09-20 NOTE — Transfer of Care (Signed)
Immediate Anesthesia Transfer of Care Note  Patient: Kelli Washington  Procedure(s) Performed: CYSTOSCOPY WITH STENT PLACEMENT.  Left Retrograde Pyelogram (N/A Ureter)  Patient Location: PACU  Anesthesia Type:General  Level of Consciousness: sedated  Airway & Oxygen Therapy: Patient Spontanous Breathing and Patient connected to face mask oxygen  Post-op Assessment: Report given to RN and Post -op Vital signs reviewed and stable  Post vital signs: Reviewed and stable  Last Vitals:  Vitals Value Taken Time  BP    Temp    Pulse 113 09/20/19 0954  Resp 17 09/20/19 0954  SpO2 100 % 09/20/19 0954  Vitals shown include unvalidated device data.  Last Pain:  Vitals:   09/20/19 0617  TempSrc:   PainSc: 4       Patients Stated Pain Goal: 3 (09/20/19 0549)  Complications: No apparent anesthesia complications

## 2019-09-20 NOTE — H&P (Addendum)
H&P Physician requesting consult: Dierdre Forth, PA  Chief Complaint: left flank pain  History of Present Illness: 39 year old woman who presents to the ER with left hip and flank pain found to have a leukocytosis of 27 and a CT scan that shows a 5 mm mid left ureteral calculus with mild left hydronephrosis and possible developing abscess.  Patient has history of nephrolithiasis diagnosed 11 years ago but has never passed stones spontaneously and has never needed surgery for them.  Past Medical History:  Diagnosis Date  . Anxiety   . Arthritis   . Asthma   . Depression   . Dysmenorrhea   . Fibromyalgia 2016  . Vaginal delivery 1999,2002,2010,2013   Past Surgical History:  Procedure Laterality Date  . CERVICAL BIOPSY  W/ LOOP ELECTRODE EXCISION    . CHOLECYSTECTOMY N/A 01/13/2018   Procedure: LAPAROSCOPIC CHOLECYSTECTOMY WITH INTRAOPERATIVE CHOLANGIOGRAM;  Surgeon: Berna Bue, MD;  Location: [facility];  Service: General;  Laterality: N/A;  . COLPOSCOPY    . TUBAL LIGATION    . WISDOM TOOTH EXTRACTION  2006    Home Medications:  (Not in a hospital admission)  Allergies:  Allergies  Allergen Reactions  . Egg [Eggs Or Egg-Derived Products] Diarrhea and Other (See Comments)    Severe stomach pain    Family History  Problem Relation Age of Onset  . Arthritis Mother   . Asthma Mother   . Cancer Mother        liver  . Depression Mother   . Drug abuse Mother   . Early death Mother   . Drug abuse Father   . Heart attack Father   . Heart disease Father   . Kidney disease Father   . Stroke Father   . Stroke Maternal Grandmother    Social History:  reports that she has been smoking cigarettes. She has been smoking about 1.00 pack per day. She has never used smokeless tobacco. She reports current alcohol use of about 1.0 - 2.0 standard drinks of alcohol per week. She reports current drug use. Drugs: Methamphetamines and Marijuana.  ROS: A complete review of systems  was performed.  All systems are negative except for pertinent findings as noted. ROS   Physical Exam:  Vital signs in last 24 hours: Temp:  [98 F (36.7 C)] 98 F (36.7 C) (03/12 2242) Pulse Rate:  [95-118] 102 (03/13 0518) Resp:  [16-20] 16 (03/13 0518) BP: (114-135)/(74-87) 131/86 (03/13 0518) SpO2:  [97 %-100 %] 98 % (03/13 0518) Weight:  [65.8 kg] 65.8 kg (03/12 2256) General:  Alert and oriented, No acute distress HEENT: Normocephalic, atraumatic Neck: No JVD or lymphadenopathy Cardiovascular: Regular rate and rhythm Lungs: Regular rate and effort Abdomen: Soft, LUQ ttp, nondistended, no abdominal masses  Back: +LEFT CVA tenderness Extremities: No edema Neurologic: Grossly intact  Laboratory Data:  Results for orders placed or performed during the hospital encounter of 09/20/19 (from the past 24 hour(s))  Pregnancy, urine     Status: None   Collection Time: 09/19/19 11:04 PM  Result Value Ref Range   Preg Test, Ur NEGATIVE NEGATIVE  CBC with Differential     Status: Abnormal   Collection Time: 09/20/19  2:48 AM  Result Value Ref Range   WBC 26.7 (H) 4.0 - 10.5 K/uL   RBC 5.10 3.87 - 5.11 MIL/uL   Hemoglobin 15.5 (H) 12.0 - 15.0 g/dL   HCT 63.1 (H) 49.7 - 02.6 %   MCV 90.8 80.0 - 100.0 fL  MCH 30.4 26.0 - 34.0 pg   MCHC 33.5 30.0 - 36.0 g/dL   RDW 72.5 36.6 - 44.0 %   Platelets 189 150 - 400 K/uL   nRBC 0.0 0.0 - 0.2 %   Neutrophils Relative % 93 %   Neutro Abs 25.0 (H) 1.7 - 7.7 K/uL   Lymphocytes Relative 3 %   Lymphs Abs 0.8 0.7 - 4.0 K/uL   Monocytes Relative 3 %   Monocytes Absolute 0.7 0.1 - 1.0 K/uL   Eosinophils Relative 0 %   Eosinophils Absolute 0.0 0.0 - 0.5 K/uL   Basophils Relative 0 %   Basophils Absolute 0.1 0.0 - 0.1 K/uL   RBC Morphology MORPHOLOGY UNREMARKABLE    Immature Granulocytes 1 %   Abs Immature Granulocytes 0.13 (H) 0.00 - 0.07 K/uL  Comprehensive metabolic panel     Status: Abnormal   Collection Time: 09/20/19  2:48 AM  Result  Value Ref Range   Sodium 136 135 - 145 mmol/L   Potassium 3.5 3.5 - 5.1 mmol/L   Chloride 103 98 - 111 mmol/L   CO2 23 22 - 32 mmol/L   Glucose, Bld 121 (H) 70 - 99 mg/dL   BUN 15 6 - 20 mg/dL   Creatinine, Ser 3.47 0.44 - 1.00 mg/dL   Calcium 9.0 8.9 - 42.5 mg/dL   Total Protein 7.3 6.5 - 8.1 g/dL   Albumin 3.9 3.5 - 5.0 g/dL   AST 20 15 - 41 U/L   ALT 15 0 - 44 U/L   Alkaline Phosphatase 66 38 - 126 U/L   Total Bilirubin 0.9 0.3 - 1.2 mg/dL   GFR calc non Af Amer >60 >60 mL/min   GFR calc Af Amer >60 >60 mL/min   Anion gap 10 5 - 15  Lactic acid, plasma     Status: None   Collection Time: 09/20/19  5:15 AM  Result Value Ref Range   Lactic Acid, Venous 1.3 0.5 - 1.9 mmol/L  Urinalysis, Routine w reflex microscopic     Status: Abnormal   Collection Time: 09/20/19  5:24 AM  Result Value Ref Range   Color, Urine YELLOW YELLOW   APPearance HAZY (A) CLEAR   Specific Gravity, Urine >1.046 (H) 1.005 - 1.030   pH 6.0 5.0 - 8.0   Glucose, UA NEGATIVE NEGATIVE mg/dL   Hgb urine dipstick LARGE (A) NEGATIVE   Bilirubin Urine NEGATIVE NEGATIVE   Ketones, ur NEGATIVE NEGATIVE mg/dL   Protein, ur NEGATIVE NEGATIVE mg/dL   Nitrite POSITIVE (A) NEGATIVE   Leukocytes,Ua LARGE (A) NEGATIVE   RBC / HPF >50 (H) 0 - 5 RBC/hpf   WBC, UA >50 (H) 0 - 5 WBC/hpf   Bacteria, UA MANY (A) NONE SEEN   Squamous Epithelial / LPF 0-5 0 - 5   Mucus PRESENT   Rapid urine drug screen (hospital performed)     Status: Abnormal   Collection Time: 09/20/19  5:24 AM  Result Value Ref Range   Opiates POSITIVE (A) NONE DETECTED   Cocaine NONE DETECTED NONE DETECTED   Benzodiazepines NONE DETECTED NONE DETECTED   Amphetamines POSITIVE (A) NONE DETECTED   Tetrahydrocannabinol POSITIVE (A) NONE DETECTED   Barbiturates NONE DETECTED NONE DETECTED   No results found for this or any previous visit (from the past 240 hour(s)). Creatinine: Recent Labs    09/20/19 0248  CREATININE 0.82   CT of the abdomen and  pelvis 09/20/2019: IMPRESSION: No evidence of traumatic injury to the abdomen/pelvis.  5 mm mid left ureteral calculus at the level of the left sacral ureter. Associated mild left hydroureteronephrosis.  Superimposed infection/pyelonephritis involving the left kidney is difficult to entirely exclude. Notably, there is a 14 mm complex cystic lesion in the interpolar region which would be concerning for developing small perirenal abscess in this setting. Correlate with laboratory evaluation and clinical examination.  Bladder is within normal limits on CT.  Impression/Assessment:  39 year old woman with acute onset left flank pain found to have a 5 mm left ureteral calculus associated with mild hydronephrosis and possible developing abscess with significant leukocytosis of 27.  Plan:  -Plan to admit to urology.  - Will take to the operating room for cystoscopy with left ureteral stent placement.   -Risks and benefits of the procedure were discussed with the patient including bleeding, infection, pain, need for staged procedure, demonstrate starting structures, need for additional treatment. -follow up urine cultures -continue IV antibiotics and trend WBC, follow up urine culture  Alixandra Alfieri D Eleonora Peeler 09/20/2019, 6:50 AM

## 2019-09-20 NOTE — Anesthesia Procedure Notes (Signed)
Procedure Name: Intubation Date/Time: 09/20/2019 9:14 AM Performed by: Lind Covert, CRNA Pre-anesthesia Checklist: Patient identified, Emergency Drugs available, Suction available, Timeout performed and Patient being monitored Patient Re-evaluated:Patient Re-evaluated prior to induction Oxygen Delivery Method: Circle system utilized Preoxygenation: Pre-oxygenation with 100% oxygen Induction Type: IV induction, Rapid sequence and Cricoid Pressure applied Laryngoscope Size: Mac and 4 Grade View: Grade I Tube type: Oral Tube size: 7.0 mm Number of attempts: 1 Airway Equipment and Method: Stylet Secured at: 21 cm Tube secured with: Tape Dental Injury: Teeth and Oropharynx as per pre-operative assessment

## 2019-09-21 LAB — BLOOD CULTURE ID PANEL (REFLEXED)

## 2019-09-21 LAB — BASIC METABOLIC PANEL
Anion gap: 9 (ref 5–15)
BUN: 16 mg/dL (ref 6–20)
CO2: 24 mmol/L (ref 22–32)
Calcium: 8.6 mg/dL — ABNORMAL LOW (ref 8.9–10.3)
Chloride: 106 mmol/L (ref 98–111)
Creatinine, Ser: 0.72 mg/dL (ref 0.44–1.00)
GFR calc Af Amer: >60 mL/min (ref >60)
GFR calc non Af Amer: >60 mL/min (ref >60)
Glucose, Bld: 140 mg/dL — ABNORMAL HIGH (ref 70–99)
Potassium: 3.6 mmol/L (ref 3.5–5.1)
Sodium: 139 mmol/L (ref 135–145)

## 2019-09-21 LAB — CBC
HCT: 37.8 % (ref 36.0–46.0)
Hemoglobin: 12.4 g/dL (ref 12.0–15.0)
MCH: 30.5 pg (ref 26.0–34.0)
MCHC: 32.8 g/dL (ref 30.0–36.0)
MCV: 92.9 fL (ref 80.0–100.0)
Platelets: 152 10*3/uL (ref 150–400)
RBC: 4.07 MIL/uL (ref 3.87–5.11)
RDW: 13.3 % (ref 11.5–15.5)
WBC: 29.1 10*3/uL — ABNORMAL HIGH (ref 4.0–10.5)
nRBC: 0 % (ref 0.0–0.2)

## 2019-09-21 MED ORDER — FLEET ENEMA 7-19 GM/118ML RE ENEM
1.0000 | ENEMA | Freq: Once | RECTAL | Status: AC
  Administered 2019-09-22: 1 via RECTAL
  Filled 2019-09-21: qty 1

## 2019-09-21 MED ORDER — PIPERACILLIN-TAZOBACTAM 3.375 G IVPB 30 MIN
3.3750 g | Freq: Three times a day (TID) | INTRAVENOUS | Status: DC
  Administered 2019-09-21 – 2019-09-22 (×4): 3.375 g via INTRAVENOUS
  Filled 2019-09-21 (×7): qty 50

## 2019-09-21 MED ORDER — SODIUM CHLORIDE 0.9 % IV SOLN
2.0000 g | Freq: Every day | INTRAVENOUS | Status: DC
  Administered 2019-09-21: 2 g via INTRAVENOUS
  Filled 2019-09-21: qty 2

## 2019-09-21 MED ORDER — SODIUM CHLORIDE 0.9 % IV BOLUS
1000.0000 mL | Freq: Once | INTRAVENOUS | Status: AC
  Administered 2019-09-21: 1000 mL via INTRAVENOUS

## 2019-09-21 NOTE — Progress Notes (Signed)
Patient alert and oriented in no distress, yellow MEWs protocol  initiated.  Notified Dr. Arita Miss of patient's BP and HR order given for NS 1L bolus. Will continue to monitor patient and F/U with plan of care.

## 2019-09-21 NOTE — Progress Notes (Signed)
Patient alert and oriented in no distress, yellow MEWs protocol  initiated.  Notified Dr. Pace of patient's BP and HR order given for NS 1L bolus. Will continue to monitor patient and F/U with plan of care.  

## 2019-09-21 NOTE — Progress Notes (Signed)
PHARMACY - PHYSICIAN COMMUNICATION CRITICAL VALUE ALERT - BLOOD CULTURE IDENTIFICATION (BCID)  Kelli Washington is an 39 y.o. female who presented to [facility] on 09/20/2019 with a chief complaint of pelvic pain  Assessment:  Patient with cystoscopy with stent placement performed for left flank pain.  Started on ceftriaxone 1gm iv q24hr (include suspected source if known)  Name of physician (or Provider) Contacted: Dr. Arita Miss  Current antibiotics: Ceftriaxone 1gm iv q24hr  Changes to prescribed antibiotics recommended:  Recommendations accepted by provider  Change to ceftriaxone 2gm iv q24hr  Results for orders placed or performed during the hospital encounter of 09/20/19  Blood Culture ID Panel (Reflexed) (Collected: 09/20/2019  6:29 AM)  Result Value Ref Range   Enterococcus species NOT DETECTED NOT DETECTED   Listeria monocytogenes NOT DETECTED NOT DETECTED   Staphylococcus species NOT DETECTED NOT DETECTED   Staphylococcus aureus (BCID) NOT DETECTED NOT DETECTED   Streptococcus species NOT DETECTED NOT DETECTED   Streptococcus agalactiae NOT DETECTED NOT DETECTED   Streptococcus pneumoniae NOT DETECTED NOT DETECTED   Streptococcus pyogenes NOT DETECTED NOT DETECTED   Acinetobacter baumannii NOT DETECTED NOT DETECTED   Enterobacteriaceae species DETECTED (A) NOT DETECTED   Enterobacter cloacae complex NOT DETECTED NOT DETECTED   Escherichia coli NOT DETECTED NOT DETECTED   Klebsiella oxytoca NOT DETECTED NOT DETECTED   Klebsiella pneumoniae DETECTED (A) NOT DETECTED   Proteus species NOT DETECTED NOT DETECTED   Serratia marcescens NOT DETECTED NOT DETECTED   Carbapenem resistance NOT DETECTED NOT DETECTED   Haemophilus influenzae NOT DETECTED NOT DETECTED   Neisseria meningitidis NOT DETECTED NOT DETECTED   Pseudomonas aeruginosa NOT DETECTED NOT DETECTED   Candida albicans NOT DETECTED NOT DETECTED   Candida glabrata NOT DETECTED NOT DETECTED   Candida krusei NOT DETECTED  NOT DETECTED   Candida parapsilosis NOT DETECTED NOT DETECTED   Candida tropicalis NOT DETECTED NOT DETECTED    Aleene Davidson Crowford 09/21/2019  1:08 AM
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
## 2019-09-22 LAB — CULTURE, BLOOD (ROUTINE X 2)

## 2019-09-22 LAB — CBC
HCT: 36.5 % (ref 36.0–46.0)
Hemoglobin: 12 g/dL (ref 12.0–15.0)
MCH: 30 pg (ref 26.0–34.0)
MCHC: 32.9 g/dL (ref 30.0–36.0)
MCV: 91.3 fL (ref 80.0–100.0)
Platelets: 127 10*3/uL — ABNORMAL LOW (ref 150–400)
RBC: 4 MIL/uL (ref 3.87–5.11)
RDW: 13.3 % (ref 11.5–15.5)
WBC: 11.7 10*3/uL — ABNORMAL HIGH (ref 4.0–10.5)
nRBC: 0 % (ref 0.0–0.2)

## 2019-09-22 LAB — BASIC METABOLIC PANEL
Anion gap: 6 (ref 5–15)
BUN: 9 mg/dL (ref 6–20)
CO2: 24 mmol/L (ref 22–32)
Calcium: 8.3 mg/dL — ABNORMAL LOW (ref 8.9–10.3)
Chloride: 103 mmol/L (ref 98–111)
Creatinine, Ser: 0.83 mg/dL (ref 0.44–1.00)
GFR calc Af Amer: >60 mL/min (ref >60)
GFR calc non Af Amer: >60 mL/min (ref >60)
Glucose, Bld: 97 mg/dL (ref 70–99)
Potassium: 3.6 mmol/L (ref 3.5–5.1)
Sodium: 133 mmol/L — ABNORMAL LOW (ref 135–145)

## 2019-09-22 LAB — URINE CULTURE: Culture: >=100000 — AB

## 2019-09-22 MED ORDER — CEFAZOLIN SODIUM-DEXTROSE 2-4 GM/100ML-% IV SOLN
2.0000 g | Freq: Three times a day (TID) | INTRAVENOUS | Status: DC
  Administered 2019-09-22 – 2019-09-23 (×3): 2 g via INTRAVENOUS
  Filled 2019-09-22 (×5): qty 100

## 2019-09-22 MED ORDER — SENNOSIDES-DOCUSATE SODIUM 8.6-50 MG PO TABS
1.0000 | ORAL_TABLET | Freq: Two times a day (BID) | ORAL | Status: DC
  Administered 2019-09-22 – 2019-09-23 (×3): 1 via ORAL
  Filled 2019-09-22 (×3): qty 1

## 2019-09-22 NOTE — Plan of Care (Signed)

## 2019-09-22 NOTE — Progress Notes (Signed)
Urology Inpatient Progress Report  Lumbar pain [M54.5] Pyelonephritis [N12] Left flank pain [R10.9] Left ureteral calculus [N20.1] Ureteral stone with hydronephrosis [N13.2]  Procedure(s): CYSTOSCOPY WITH STENT PLACEMENT.  Left Retrograde Pyelogram  2 Days Post-Op   Intv/Subj: No acute events overnight. Patient is without complaint.  Pain improving on left side.  Pt with frequent urination.   Active Problems:   Left ureteral calculus  Current Facility-Administered Medications  Medication Dose Route Frequency Provider Last Rate Last Admin  . 0.9 %  sodium chloride infusion  1,000 mL Intravenous Continuous Muthersbaugh, Hannah, PA-C 125 mL/hr at 09/22/19 0452 1,000 mL at 09/22/19 0452  . acetaminophen (TYLENOL) tablet 650 mg  650 mg Oral Q6H PRN Kasandra Knudsen D, MD   650 mg at 09/21/19 1755  . docusate sodium (COLACE) capsule 100 mg  100 mg Oral BID Kasandra Knudsen D, MD   100 mg at 09/21/19 2037  . ondansetron (ZOFRAN) tablet 4 mg  4 mg Oral Q8H PRN Kasandra Knudsen D, MD      . oxybutynin (DITROPAN) tablet 5 mg  5 mg Oral Q8H PRN Kasandra Knudsen D, MD      . oxyCODONE (Oxy IR/ROXICODONE) immediate release tablet 5 mg  5 mg Oral Q4H PRN Kasandra Knudsen D, MD   5 mg at 09/22/19 0449  . phenazopyridine (PYRIDIUM) tablet 200 mg  200 mg Oral TID WC Kasandra Knudsen D, MD   200 mg at 09/21/19 1746  . piperacillin-tazobactam (ZOSYN) IVPB 3.375 g  3.375 g Intravenous Q8H Kasandra Knudsen D, MD 12.5 mL/hr at 09/22/19 0118 3.375 g at 09/22/19 0118  . tamsulosin (FLOMAX) capsule 0.4 mg  0.4 mg Oral Daily Kasandra Knudsen D, MD   0.4 mg at 09/21/19 0932     Objective: Vital: Vitals:   09/21/19 1934 09/21/19 2343 09/22/19 0100 09/22/19 0451  BP: (!) 102/59 100/68  120/71  Pulse: (!) 101 90  99  Resp: 19 17  18   Temp: 99.7 F (37.6 C) 100.1 F (37.8 C) 99.7 F (37.6 C) 98.6 F (37 C)  TempSrc: Oral Oral Oral Oral  SpO2: 95% 95%  94%  Weight:      Height:       I/Os: I/O last 3  completed shifts: In: 5443.6 [P.O.:240; I.V.:4108.4; IV Piggyback:1095.2] Out: 3500 [Urine:3500]  Physical Exam:  General: Patient is in no apparent distress Lungs: Normal respiratory effort, chest expands symmetrically. GI: The abdomen is soft and nontender without mass. Ext: lower extremities symmetric  Lab Results: Recent Labs    09/20/19 0248 09/21/19 0531 09/22/19 0445  WBC 26.7* 29.1* 11.7*  HGB 15.5* 12.4 12.0  HCT 46.3* 37.8 36.5   Recent Labs    09/20/19 0248 09/21/19 0531 09/22/19 0445  NA 136 139 133*  K 3.5 3.6 3.6  CL 103 106 103  CO2 23 24 24   GLUCOSE 121* 140* 97  BUN 15 16 9   CREATININE 0.82 0.72 0.83  CALCIUM 9.0 8.6* 8.3*   Recent Labs    09/20/19 0629  INR 1.1   No results for input(s): LABURIN in the last 72 hours. Results for orders placed or performed during the hospital encounter of 09/20/19  Urine culture     Status: Abnormal (Preliminary result)   Collection Time: 09/20/19  5:24 AM   Specimen: Urine, Clean Catch  Result Value Ref Range Status   Specimen Description   Final    URINE, CLEAN CATCH Performed at [facility], [address] [place]  [address]    Special Requests   Final    NONE Performed at [facility], [address] [place], [place] [address]    Culture (A)  Final    >=100,000 COLONIES/mL KLEBSIELLA PNEUMONIAE SUSCEPTIBILITIES TO FOLLOW Performed at [facility], [address], [place], [place] [address]    Report Status PENDING  Incomplete  Blood Culture (routine x 2)     Status: Abnormal (Preliminary result)   Collection Time: 09/20/19  6:29 AM   Specimen: BLOOD  Result Value Ref Range Status   Specimen Description   Final    BLOOD RIGHT ASSIST CONTROL Performed at [facility], [address], [place], [place] [address]    Special Requests   Final    BOTTLES DRAWN AEROBIC AND ANAEROBIC Blood Culture results may not be optimal due to  an excessive volume of blood received in culture bottles Performed at [facility], [address] [place], [place] [address]    Culture  Setup Time   Final    GRAM NEGATIVE RODS IN BOTH AEROBIC AND ANAEROBIC BOTTLES CRITICAL RESULT CALLED TO, READ BACK BY AND VERIFIED WITH: Trixie Deis 0102 09/21/2019 Girtha Hake Performed at [facility], [address] [place], [place] [address]    Culture KLEBSIELLA PNEUMONIAE (A)  Final   Report Status PENDING  Incomplete  Blood Culture ID Panel (Reflexed)     Status: Abnormal   Collection Time: 09/20/19  6:29 AM  Result Value Ref Range Status   Enterococcus species NOT DETECTED NOT DETECTED Final   Listeria monocytogenes NOT DETECTED NOT DETECTED Final   Staphylococcus species NOT DETECTED NOT DETECTED Final   Staphylococcus aureus (BCID) NOT DETECTED NOT DETECTED Final   Streptococcus species NOT DETECTED NOT DETECTED Final   Streptococcus agalactiae NOT DETECTED NOT DETECTED Final   Streptococcus pneumoniae NOT DETECTED NOT DETECTED Final   Streptococcus pyogenes NOT DETECTED NOT DETECTED Final   Acinetobacter baumannii NOT DETECTED NOT DETECTED Final   Enterobacteriaceae species DETECTED (A) NOT DETECTED Final    Comment: Enterobacteriaceae represent a large family of gram-negative bacteria, not a single organism. CRITICAL RESULT CALLED TO, READ BACK BY AND VERIFIED WITH: J. GRIMSLEY,PHARMD 0102 09/21/2019 T. TYSOR    Enterobacter cloacae complex NOT DETECTED NOT DETECTED Final   Escherichia coli NOT DETECTED NOT DETECTED Final   Klebsiella oxytoca NOT DETECTED NOT DETECTED Final   Klebsiella pneumoniae DETECTED (A) NOT DETECTED Final    Comment: CRITICAL RESULT CALLED TO, READ BACK BY AND VERIFIED WITH: J. GRIMSLEY,PHARMD 0102 09/21/2019 T. TYSOR    Proteus species NOT DETECTED NOT DETECTED Final   Serratia marcescens NOT DETECTED NOT DETECTED Final   Carbapenem resistance NOT DETECTED NOT DETECTED Final    Haemophilus influenzae NOT DETECTED NOT DETECTED Final   Neisseria meningitidis NOT DETECTED NOT DETECTED Final   Pseudomonas aeruginosa NOT DETECTED NOT DETECTED Final   Candida albicans NOT DETECTED NOT DETECTED Final   Candida glabrata NOT DETECTED NOT DETECTED Final   Candida krusei NOT DETECTED NOT DETECTED Final   Candida parapsilosis NOT DETECTED NOT DETECTED Final   Candida tropicalis NOT DETECTED NOT DETECTED Final    Comment: Performed at [facility], [address] [place], [place] [address]  Blood Culture (routine x 2)     Status: None (Preliminary result)   Collection Time: 09/20/19  6:34 AM   Specimen: BLOOD  Result Value Ref Range Status   Specimen Description  Final    BLOOD LEFT ASSIST CONTROL Performed at [place] [address] [place], [place] [address]    Special Requests   Final    BOTTLES DRAWN AEROBIC AND ANAEROBIC Blood Culture results may not be optimal due to an excessive volume of blood received in culture bottles Performed at [facility] [address] [place], [place] [address]    Culture  Setup Time   Final    GRAM NEGATIVE RODS IN BOTH AEROBIC AND ANAEROBIC BOTTLES CRITICAL RESULT CALLED TO, READ BACK BY AND VERIFIED WITH: Audrea Muscat 0102 09/21/2019 Mena Goes Performed at [facility], [place] [address] [place], [place] [address]    Culture GRAM NEGATIVE RODS  Final   Report Status PENDING  Incomplete  Respiratory Panel by RT PCR (Flu A&B, Covid) - Nasopharyngeal Swab     Status: None   Collection Time: 09/20/19  6:45 AM   Specimen: Nasopharyngeal Swab  Result Value Ref Range Status   SARS Coronavirus 2 by RT PCR NEGATIVE NEGATIVE Final    Comment: (NOTE) SARS-CoV-2 target nucleic acids are NOT DETECTED. The SARS-CoV-2 RNA is generally detectable in upper respiratoy specimens during the acute phase of infection. The lowest concentration of SARS-CoV-2 viral copies this assay can  detect is 131 copies/mL. A negative result does not preclude SARS-Cov-2 infection and should not be used as the sole basis for treatment or other patient management decisions. A negative result may occur with  improper specimen collection/handling, submission of specimen other than nasopharyngeal swab, presence of viral mutation(s) within the areas targeted by this assay, and inadequate number of viral copies (<131 copies/mL). A negative result must be combined with clinical observations, patient history, and epidemiological information. The expected result is Negative. Fact Sheet for Patients:  PinkCheek.be Fact Sheet for Healthcare Providers:  GravelBags.it This test is not yet ap proved or cleared by the [place] FDA and  has been authorized for detection and/or diagnosis of SARS-CoV-2 by FDA under an Emergency Use Authorization (EUA). This EUA will remain  in effect (meaning this test can be used) for the duration of the COVID-19 declaration under Section 564(b)(1) of the Act, 21 U.S.C. section 360bbb-3(b)(1), unless the authorization is terminated or revoked sooner.    Influenza A by PCR NEGATIVE NEGATIVE Final   Influenza B by PCR NEGATIVE NEGATIVE Final    Comment: (NOTE) The Xpert Xpress SARS-CoV-2/FLU/RSV assay is intended as an aid in  the diagnosis of influenza from Nasopharyngeal swab specimens and  should not be used as a sole basis for treatment. Nasal washings and  aspirates are unacceptable for Xpert Xpress SARS-CoV-2/FLU/RSV  testing. Fact Sheet for Patients: PinkCheek.be Fact Sheet for Healthcare Providers: GravelBags.it This test is not yet approved or cleared by the [place] FDA and  has been authorized for detection and/or diagnosis of SARS-CoV-2 by  FDA under an Emergency Use Authorization (EUA). This EUA will remain  in effect (meaning  this test can be used) for the duration of the  Covid-19 declaration under Section 564(b)(1) of the Act, 21  U.S.C. section 360bbb-3(b)(1), unless the authorization is  terminated or revoked. Performed at [facility], [place] [address], [place], [place] [address]     Studies/Results: DG C-Arm 1-60 Min-No Report  Result Date: 09/20/2019 Fluoroscopy was utilized by the requesting physician.  No radiographic interpretation.    Assessment: 39 year old woman with left mid ureteral calculus and Klebsiella bacteremia postop day 2 status post  left ureteral stent.  Leukocytosis improving from 29 now 11.    Plan: -change antibiotics to 2g ancef q 8 hours and will transition to bactrim PO for total of 14 days at time of d/c -if pt remains afebrile for 48 hours will d/c tomorrow -continue flomax -continue oxybuytnin PRN   Kasandra Knudsen, MD Urology 09/22/2019, 7:20 AM

## 2019-09-23 LAB — CBC
HCT: 36.3 % (ref 36.0–46.0)
Hemoglobin: 11.8 g/dL — ABNORMAL LOW (ref 12.0–15.0)
MCH: 29.8 pg (ref 26.0–34.0)
MCHC: 32.5 g/dL (ref 30.0–36.0)
MCV: 91.7 fL (ref 80.0–100.0)
Platelets: 135 10*3/uL — ABNORMAL LOW (ref 150–400)
RBC: 3.96 MIL/uL (ref 3.87–5.11)
RDW: 13.2 % (ref 11.5–15.5)
WBC: 9.4 10*3/uL (ref 4.0–10.5)
nRBC: 0 % (ref 0.0–0.2)

## 2019-09-23 LAB — BASIC METABOLIC PANEL
Anion gap: 7 (ref 5–15)
BUN: 7 mg/dL (ref 6–20)
CO2: 25 mmol/L (ref 22–32)
Calcium: 8.5 mg/dL — ABNORMAL LOW (ref 8.9–10.3)
Chloride: 104 mmol/L (ref 98–111)
Creatinine, Ser: 0.66 mg/dL (ref 0.44–1.00)
GFR calc Af Amer: >60 mL/min (ref >60)
GFR calc non Af Amer: >60 mL/min (ref >60)
Glucose, Bld: 112 mg/dL — ABNORMAL HIGH (ref 70–99)
Potassium: 3.6 mmol/L (ref 3.5–5.1)
Sodium: 136 mmol/L (ref 135–145)

## 2019-09-23 LAB — GLUCOSE, CAPILLARY: Glucose-Capillary: 91 mg/dL (ref 70–99)

## 2019-09-23 MED ORDER — TAMSULOSIN HCL 0.4 MG PO CAPS: 0.4000 mg | ORAL_CAPSULE | Freq: Every day | ORAL | 0 refills | Status: AC

## 2019-09-23 MED ORDER — OXYCODONE HCL 5 MG PO TABS: 5.0000 mg | ORAL_TABLET | ORAL | 0 refills | Status: DC | PRN

## 2019-09-23 MED ORDER — SENNOSIDES-DOCUSATE SODIUM 8.6-50 MG PO TABS: 1.0000 | ORAL_TABLET | Freq: Two times a day (BID) | ORAL | 0 refills | Status: DC

## 2019-09-23 MED ORDER — SULFAMETHOXAZOLE-TRIMETHOPRIM 400-80 MG PO TABS: 1.0000 | ORAL_TABLET | Freq: Two times a day (BID) | ORAL | 0 refills | Status: AC

## 2019-09-23 MED ORDER — OXYBUTYNIN CHLORIDE 5 MG PO TABS: 5.0000 mg | ORAL_TABLET | Freq: Three times a day (TID) | ORAL | 0 refills | Status: AC | PRN

## 2019-09-23 NOTE — Discharge Instructions (Signed)
DISCHARGE INSTRUCTIONS FOR KIDNEY STONE/URETERAL STENT   MEDICATIONS:  1.  Resume all your other meds from home - except do not take any extra narcotic pain meds that you may have at home.  2. Oxycodone is for moderate/severe pain, otherwise taking upto 1000 mg every 6 hours of plainTylenol will help treat your pain.   3. Take Bactrim as prescribed for 12 days for your blood stream infection 4. Oxybutynin is to help with bladder cramping/pressure/overactive bladder 5. Flomax is to help with stent discomfort.  ACTIVITY:  1. No strenuous activity x 1week  2. No driving while on narcotic pain medications  3. Drink plenty of water  4. Continue to walk at home - you can still get blood clots when you are at home, so keep active, but don't over do it.  5. May return to work/school tomorrow or when you feel ready   BATHING:  1. You can shower and we recommend daily showers    SIGNS/SYMPTOMS TO CALL:  Please call us if you have a fever greater than 101.5, uncontrolled nausea/vomiting, uncontrolled pain, dizziness, unable to urinate, bloody urine, chest pain, shortness of breath, leg swelling, leg pain, redness around wound, drainage from wound, or any other concerns or questions.   You can reach [place] at [phone].   FOLLOW-UP:  1. You have an appointment next week to plan your next surgery to remove the kidney stone

## 2019-09-23 NOTE — Discharge Summary (Signed)
Date of admission: 09/20/2019  Date of discharge: 09/23/2019  Admission diagnosis: left ureteral calculus with pyelonephritis  Discharge diagnosis: left ureteral calculus with pyelonephritis and bacteremia  Secondary diagnoses:  Patient Active Problem List   Diagnosis Date Noted  . Left ureteral calculus 09/20/2019  . Skin lesion of back 02/19/2019  . Acute gallstone pancreatitis 01/12/2018  . Leukocytosis 01/12/2018  . Elevated LFTs 01/12/2018  . Fibromyalgia 01/03/2018    Procedures performed: Procedure(s): CYSTOSCOPY WITH STENT PLACEMENT.  Left Retrograde Pyelogram  History and Physical: For full details, please see admission history and physical. Briefly, Kelli Washington is a 39 y.o. year old woman who presented to ED with left flank pain found to have a 5 mm left mid ureteral calculus and significant leukocytosis with findings concerning for pyelonephritis. Her blood cultures returned with klebsiella bacteremia.  Patient underwent cystoscopy with left ureteral stent placement.    Hospital Course: Patient tolerated the procedure well.  She was then transferred to the floor after an uneventful PACU stay.  Her hospital course was uncomplicated.  On POD#3 she had met discharge criteria: was eating a regular diet, was up and ambulating independently,  pain was well controlled, was voiding without a catheter, and was ready to for discharge.   Laboratory values:  Recent Labs    09/21/19 0531 09/22/19 0445 09/23/19 0554  WBC 29.1* 11.7* 9.4  HGB 12.4 12.0 11.8*  HCT 37.8 36.5 36.3   Recent Labs    09/21/19 0531 09/22/19 0445 09/23/19 0554  NA 139 133* 136  K 3.6 3.6 3.6  CL 106 103 104  CO2 _0 GLUCOSE 140* 97 112*  BUN _1 CREATININE 0.72 0.83 0.66  CALCIUM 8.6* 8.3* 8.5*   No results for input(s): LABPT, INR in the last 72 hours. No results for input(s): LABURIN in the last 72 hours. Results for orders placed or performed during the hospital encounter of  09/20/19  Urine culture     Status: Abnormal   Collection Time: 09/20/19  5:24 AM   Specimen: Urine, Clean Catch  Result Value Ref Range Status   Specimen Description   Final    URINE, CLEAN CATCH Performed at [facility], [place] [address] [place], [place] [address]    Special Requests   Final    NONE Performed at [facility], [place] [address] [place], [place] [address]    Culture >=100,000 COLONIES/mL KLEBSIELLA PNEUMONIAE (A)  Final   Report Status 09/22/2019 FINAL  Final   Organism ID, Bacteria KLEBSIELLA PNEUMONIAE (A)  Final      Susceptibility   Klebsiella pneumoniae - MIC*    AMPICILLIN RESISTANT Resistant     CEFAZOLIN <=4 SENSITIVE Sensitive     CEFTRIAXONE <=0.25 SENSITIVE Sensitive     CIPROFLOXACIN <=0.25 SENSITIVE Sensitive     GENTAMICIN <=1 SENSITIVE Sensitive     IMIPENEM <=0.25 SENSITIVE Sensitive     NITROFURANTOIN 64 INTERMEDIATE Intermediate     TRIMETH/SULFA <=20 SENSITIVE Sensitive     AMPICILLIN/SULBACTAM <=2 SENSITIVE Sensitive     PIP/TAZO <=4 SENSITIVE Sensitive     * >=100,000 COLONIES/mL KLEBSIELLA PNEUMONIAE  Blood Culture (routine x 2)     Status: Abnormal   Collection Time: 09/20/19  6:29 AM   Specimen: BLOOD  Result Value Ref Range Status   Specimen Description   Final    BLOOD RIGHT ANTECUBITAL Performed at [facility], [place] [address] [place], [place] [address]  Special Requests   Final    BOTTLES DRAWN AEROBIC AND ANAEROBIC Blood Culture results may not be optimal due to an excessive volume of blood received in culture bottles Performed at [place] [address], [place], [place] [address]    Culture  Setup Time   Final    GRAM NEGATIVE RODS IN BOTH AEROBIC AND ANAEROBIC BOTTLES CRITICAL RESULT CALLED TO, READ BACK BY AND VERIFIED WITH: Audrea Muscat 0102 09/21/2019 Mena Goes Performed at [facility], [place] [address] [place], [place] [address]    Culture  KLEBSIELLA PNEUMONIAE (A)  Final   Report Status 09/22/2019 FINAL  Final   Organism ID, Bacteria KLEBSIELLA PNEUMONIAE  Final      Susceptibility   Klebsiella pneumoniae - MIC*    AMPICILLIN RESISTANT Resistant     CEFAZOLIN <=4 SENSITIVE Sensitive     CEFEPIME <=0.12 SENSITIVE Sensitive     CEFTAZIDIME <=1 SENSITIVE Sensitive     CEFTRIAXONE <=0.25 SENSITIVE Sensitive     CIPROFLOXACIN <=0.25 SENSITIVE Sensitive     GENTAMICIN <=1 SENSITIVE Sensitive     IMIPENEM <=0.25 SENSITIVE Sensitive     TRIMETH/SULFA <=20 SENSITIVE Sensitive     AMPICILLIN/SULBACTAM <=2 SENSITIVE Sensitive     PIP/TAZO <=4 SENSITIVE Sensitive     * KLEBSIELLA PNEUMONIAE  Blood Culture ID Panel (Reflexed)     Status: Abnormal   Collection Time: 09/20/19  6:29 AM  Result Value Ref Range Status   Enterococcus species NOT DETECTED NOT DETECTED Final   Listeria monocytogenes NOT DETECTED NOT DETECTED Final   Staphylococcus species NOT DETECTED NOT DETECTED Final   Staphylococcus aureus (BCID) NOT DETECTED NOT DETECTED Final   Streptococcus species NOT DETECTED NOT DETECTED Final   Streptococcus agalactiae NOT DETECTED NOT DETECTED Final   Streptococcus pneumoniae NOT DETECTED NOT DETECTED Final   Streptococcus pyogenes NOT DETECTED NOT DETECTED Final   Acinetobacter baumannii NOT DETECTED NOT DETECTED Final   Enterobacteriaceae species DETECTED (A) NOT DETECTED Final    Comment: Enterobacteriaceae represent a large family of gram-negative bacteria, not a single organism. CRITICAL RESULT CALLED TO, READ BACK BY AND VERIFIED WITH: J. GRIMSLEY,PHARMD 0102 09/21/2019 T. TYSOR    Enterobacter cloacae complex NOT DETECTED NOT DETECTED Final   Escherichia coli NOT DETECTED NOT DETECTED Final   Klebsiella oxytoca NOT DETECTED NOT DETECTED Final   Klebsiella pneumoniae DETECTED (A) NOT DETECTED Final    Comment: CRITICAL RESULT CALLED TO, READ BACK BY AND VERIFIED WITH: J. GRIMSLEY,PHARMD 0102 09/21/2019 T. TYSOR     Proteus species NOT DETECTED NOT DETECTED Final   Serratia marcescens NOT DETECTED NOT DETECTED Final   Carbapenem resistance NOT DETECTED NOT DETECTED Final   Haemophilus influenzae NOT DETECTED NOT DETECTED Final   Neisseria meningitidis NOT DETECTED NOT DETECTED Final   Pseudomonas aeruginosa NOT DETECTED NOT DETECTED Final   Candida albicans NOT DETECTED NOT DETECTED Final   Candida glabrata NOT DETECTED NOT DETECTED Final   Candida krusei NOT DETECTED NOT DETECTED Final   Candida parapsilosis NOT DETECTED NOT DETECTED Final   Candida tropicalis NOT DETECTED NOT DETECTED Final    Comment: Performed at [facility], [place], [place], [place] [address]  Blood Culture (routine x 2)     Status: None (Preliminary result)   Collection Time: 09/20/19  6:34 AM   Specimen: BLOOD  Result Value Ref Range Status   Specimen Description   Final    BLOOD LEFT ANTECUBITAL Performed at [facility], [place] [address] [place], [place] [address]    Special Requests   Final    BOTTLES DRAWN AEROBIC AND ANAEROBIC Blood Culture results may not be optimal due to an excessive volume of blood received in culture bottles Performed at [facility] [address] [place], [place] [address]    Culture  Setup Time   Final    GRAM NEGATIVE RODS IN BOTH AEROBIC AND ANAEROBIC BOTTLES CRITICAL RESULT CALLED TO, READ BACK BY AND VERIFIED WITH: Audrea Muscat 0102 09/21/2019 Mena Goes Performed at [facility], [place] [address], [place], [place] [address]    Culture GRAM NEGATIVE RODS  Final   Report Status PENDING  Incomplete  Respiratory Panel by RT PCR (Flu A&B, Covid) - Nasopharyngeal Swab     Status: None   Collection Time: 09/20/19  6:45 AM   Specimen: Nasopharyngeal Swab  Result Value Ref Range Status   SARS Coronavirus 2 by RT PCR NEGATIVE NEGATIVE Final    Comment: (NOTE) SARS-CoV-2 target nucleic acids are NOT DETECTED. The SARS-CoV-2 RNA is generally  detectable in upper respiratoy specimens during the acute phase of infection. The lowest concentration of SARS-CoV-2 viral copies this assay can detect is 131 copies/mL. A negative result does not preclude SARS-Cov-2 infection and should not be used as the sole basis for treatment or other patient management decisions. A negative result may occur with  improper specimen collection/handling, submission of specimen other than nasopharyngeal swab, presence of viral mutation(s) within the areas targeted by this assay, and inadequate number of viral copies (<131 copies/mL). A negative result must be combined with clinical observations, patient history, and epidemiological information. The expected result is Negative. Fact Sheet for Patients:  PinkCheek.be Fact Sheet for Healthcare Providers:  GravelBags.it This test is not yet ap proved or cleared by the [place] FDA and  has been authorized for detection and/or diagnosis of SARS-CoV-2 by FDA under an Emergency Use Authorization (EUA). This EUA will remain  in effect (meaning this test can be used) for the duration of the COVID-19 declaration under Section 564(b)(1) of the Act, 21 U.S.C. section 360bbb-3(b)(1), unless the authorization is terminated or revoked sooner.    Influenza A by PCR NEGATIVE NEGATIVE Final   Influenza B by PCR NEGATIVE NEGATIVE Final    Comment: (NOTE) The Xpert Xpress SARS-CoV-2/FLU/RSV assay is intended as an aid in  the diagnosis of influenza from Nasopharyngeal swab specimens and  should not be used as a sole basis for treatment. Nasal washings and  aspirates are unacceptable for Xpert Xpress SARS-CoV-2/FLU/RSV  testing. Fact Sheet for Patients: PinkCheek.be Fact Sheet for Healthcare Providers: GravelBags.it This test is not yet approved or cleared by the [place] FDA and  has been  authorized for detection and/or diagnosis of SARS-CoV-2 by  FDA under an Emergency Use Authorization (EUA). This EUA will remain  in effect (meaning this test can be used) for the duration of the  Covid-19 declaration under Section 564(b)(1) of the Act, 21  U.S.C. section 360bbb-3(b)(1), unless the authorization is  terminated or revoked. Performed at [facility], [place] [address] [place], [place] [address]     Disposition: Home  Discharge instruction: The patient was instructed to be ambulatory but told to refrain from heavy lifting, strenuous activity, or driving.   Discharge medications:  Allergies as of 09/23/2019      Reactions   Egg [eggs Or Egg-derived Products] Diarrhea, Other (See Comments)  Severe stomach pain      Medication List    STOP taking these medications   clotrimazole 10 MG troche Commonly known as: MYCELEX   naproxen 500 MG tablet Commonly known as: NAPROSYN     TAKE these medications   albuterol 108 (90 Base) MCG/ACT inhaler Commonly known as: VENTOLIN HFA Inhale 1-2 puffs into the lungs every 6 (six) hours as needed for wheezing or shortness of breath.   aspirin-acetaminophen-caffeine 250-250-65 MG tablet Commonly known as: EXCEDRIN MIGRAINE Take 1 tablet by mouth every 6 (six) hours as needed for headache (pain).   oxybutynin 5 MG tablet Commonly known as: DITROPAN Take 1 tablet (5 mg total) by mouth every 8 (eight) hours as needed for bladder spasms.   oxyCODONE 5 MG immediate release tablet Commonly known as: Oxy IR/ROXICODONE Take 1 tablet (5 mg total) by mouth every 4 (four) hours as needed for severe pain.   senna-docusate 8.6-50 MG tablet Commonly known as: Senokot-S Take 1 tablet by mouth 2 (two) times daily.   sulfamethoxazole-trimethoprim 400-80 MG tablet Commonly known as: Bactrim Take 1 tablet by mouth 2 (two) times daily.   tamsulosin 0.4 MG Caps capsule Commonly known as: FLOMAX Take 1 capsule (0.4 mg  total) by mouth daily.       Followup:  Follow-up Information    [facility] On 10/02/2019.   Why: 9:00am with Dr. Oretha Ellis information: [place]

## 2019-09-23 NOTE — Progress Notes (Signed)
Patient discharged home.  IV removed - WNL.  Reviewed AVS and medications.  Follow up in place.  Patient verbalizes understanding - all questions answered satisfactorily.  Emphasized importance of completing entire dose of abx.  Patient in NAD - assisted off unit via WC by RN

## 2019-09-24 LAB — CULTURE, BLOOD (ROUTINE X 2)

## 2019-10-02 ENCOUNTER — Ambulatory Visit (HOSPITAL_BASED_OUTPATIENT_CLINIC_OR_DEPARTMENT_OTHER): Payer: Medicaid Other | Admitting: Physician Assistant

## 2019-10-02 ENCOUNTER — Encounter (HOSPITAL_BASED_OUTPATIENT_CLINIC_OR_DEPARTMENT_OTHER): Payer: Self-pay | Admitting: Urology

## 2019-10-02 ENCOUNTER — Other Ambulatory Visit: Payer: Self-pay | Admitting: Urology

## 2019-10-02 ENCOUNTER — Other Ambulatory Visit: Payer: Self-pay

## 2019-10-02 NOTE — [place] (Addendum)
Addendum: spoke with jessica zanetto pa, do not need to repeat ekg done 09-20-2019, get urine drug screen day of surgery.   Spoke w/ via phone for pre-op interview---patient Lab needs dos----urine poct               COVID test -----10-03-2019 300 pm Arrive: 530 am 10-07-2019 NPO after ------midnight Medications to take morning of surgery -----albuterol inhaler, flomax, oxybutynin Diabetic medication ----- Patient Special Instructions ----- Pre-Op special Istructions ----- Patient verbalized understanding of instructions that were given at this phone interview. Patient denies shortness of breath, chest pain, fever, cough a this phone interview.  Anesthesia : positive urine drug screen 09-20-2019, can we use ekg from 09-20-2019 heart rate is 124? Do you want urine drug screen day of surgery? 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

## 2019-10-03 ENCOUNTER — Other Ambulatory Visit (HOSPITAL_COMMUNITY): Payer: Medicaid Other

## 2019-10-04 ENCOUNTER — Other Ambulatory Visit (HOSPITAL_COMMUNITY)
Admission: RE | Admit: 2019-10-04 | Discharge: 2019-10-04 | Disposition: A | Discharge status: Home / Self Care | Payer: Medicaid Other | Source: Ambulatory Visit | Attending: Urology | Admitting: Urology

## 2019-10-04 DIAGNOSIS — Z20822 Contact with and (suspected) exposure to covid-19: Secondary | ICD-10-CM | POA: Insufficient documentation

## 2019-10-04 DIAGNOSIS — Z01812 Encounter for preprocedural laboratory examination: Secondary | ICD-10-CM | POA: Insufficient documentation

## 2019-10-04 LAB — SARS CORONAVIRUS 2 (TAT 6-24 HRS): SARS Coronavirus 2: NEGATIVE

## 2019-10-06 NOTE — Anesthesia Preprocedure Evaluation (Addendum)
Anesthesia Evaluation    Reviewed: Allergy & Precautions, Patient's Chart, lab work & pertinent test results  History of Anesthesia Complications Negative for: history of anesthetic complications  Airway        Dental   Pulmonary asthma , Current Smoker,           Cardiovascular negative cardio ROS       Neuro/Psych PSYCHIATRIC DISORDERS Anxiety Depression negative neurological ROS     GI/Hepatic negative GI ROS, Neg liver ROS,   Endo/Other  negative endocrine ROS  Renal/GU Renal disease (kidney stones, left ureteral stone)  negative genitourinary   Musculoskeletal  (+) Fibromyalgia -  Abdominal   Peds  Hematology  (+) anemia ,   Anesthesia Other Findings   Reproductive/Obstetrics                             Anesthesia Physical Anesthesia Plan  ASA: II  Anesthesia Plan: General   Post-op Pain Management:    Induction: Intravenous  PONV Risk Score and Plan: 2 and Ondansetron, Dexamethasone, Midazolam and Treatment may vary due to age or medical condition  Airway Management Planned: LMA  Additional Equipment: None  Intra-op Plan:   Post-operative Plan: Extubation in OR  Informed Consent:   Plan Discussed with:   Anesthesia Plan Comments: ( CASE CANCELLED 10/07/19 FOR POSITIVE UDS)       Anesthesia Quick Evaluation

## 2019-10-06 NOTE — H&P (Signed)
CC/HPI: cc: Hospital follow up   10/02/19: 39 year old woman presents the ER with left flank pain found to have a 5 mm ureteral calculus associated with significant leukocytosis and pyelonephritis of the kidney. She subsequently found to be bacteremic with pansensitive E coli. She returns today for follow-up after undergoing a stent placement and IV antibiotics the hospital. She is feeling well and without complaints today. She denies fever, chills, nausea or vomiting. She has no pain with a stent.     ALLERGIES: Egg    MEDICATIONS: Oxybutynin Chloride  Bactrim  Flomax 0.4 mg capsule     GU PSH: Cystoscopy Insert Stent, Left - 09/20/2019     NON-GU PSH: None   GU PMH: None   NON-GU PMH: None   FAMILY HISTORY: None   SOCIAL HISTORY: Marital Status: Unknown Preferred Language: English; Ethnicity: Not Hispanic Or Latino; Race: White Current Smoking Status: Patient smokes.   Tobacco Use Assessment Completed: Used Tobacco in last 30 days? Social Drinker.  Drinks 2 caffeinated drinks per day.    REVIEW OF SYSTEMS:    GU Review Female:   Patient reports frequent urination. Patient denies hard to postpone urination, burning /pain with urination, get up at night to urinate, leakage of urine, stream starts and stops, trouble starting your stream, have to strain to urinate, and being pregnant.  Gastrointestinal (Upper):   Patient denies nausea, vomiting, and indigestion/ heartburn.  Gastrointestinal (Lower):   Patient denies diarrhea and constipation.  Constitutional:   Patient reports fatigue. Patient denies fever, night sweats, and weight loss.  Skin:   Patient denies skin rash/ lesion and itching.  Eyes:   Patient denies blurred vision and double vision.  Ears/ Nose/ Throat:   Patient denies sore throat and sinus problems.  Hematologic/Lymphatic:   Patient denies swollen glands and easy bruising.  Cardiovascular:   Patient denies leg swelling and chest pains.  Respiratory:   Patient  denies cough and shortness of breath.  Endocrine:   Patient denies excessive thirst.  Musculoskeletal:   Patient denies back pain and joint pain.  Neurological:   Patient denies headaches and dizziness.  Psychologic:   Patient denies depression and anxiety.   VITAL SIGNS:      10/02/2019 09:35 AM  Weight 145 lb / 65.77 kg  Height 65 in / 165.1 cm  BP 139/78 mmHg  Pulse 91 /min  Temperature 97.8 F / 36.5 C  BMI 24.1 kg/m   MULTI-SYSTEM PHYSICAL EXAMINATION:    Constitutional: Well-nourished. No physical deformities. Normally developed. Good grooming.  Neck: Neck symmetrical, not swollen. Normal tracheal position.  Respiratory: No labored breathing, no use of accessory muscles.   Skin: No paleness, no jaundice, no cyanosis. No lesion, no ulcer, no rash.  Neurologic / Psychiatric: Oriented to time, oriented to place, oriented to person. No depression, no anxiety, no agitation.  Gastrointestinal: No mass, no tenderness, no rigidity, non obese abdomen. No CVA tenderness bilaterally.  Eyes: Normal conjunctivae. Normal eyelids.  Ears, Nose, Mouth, and Throat: Left ear no scars, no lesions, no masses. Right ear no scars, no lesions, no masses. Nose no scars, no lesions, no masses. Normal hearing. Normal lips.  Musculoskeletal: Normal gait and station of head and neck.     PAST DATA REVIEWED:  Source Of History:  Patient  Records Review:   POC Tool  Urine Test Review:   Urinalysis  Notes:  09/23/2019: BUN 7, creatinine 0.66   PROCEDURES:          Urinalysis w/Scope Dipstick Dipstick Cont'd Micro  Color: Yellow Bilirubin: Neg mg/dL WBC/hpf: 6 - 47/MLY  Appearance: Slightly Cloudy Ketones: Neg mg/dL RBC/hpf: >65/KPT  Specific Gravity: 1.025 Blood: 3+ ery/uL Bacteria: Mod (26-50/hpf)  pH: 6.5 Protein: 1+ mg/dL Cystals: NS (Not Seen)  Glucose: Neg mg/dL Urobilinogen: 0.2 mg/dL Casts: NS (Not Seen)    Nitrites: Neg Trichomonas: Not Present    Leukocyte Esterase: Trace  leu/uL Mucous: Present      Epithelial Cells: 0 - 5/hpf      Yeast: NS (Not Seen)      Sperm: Not Present    ASSESSMENT:      ICD-10 Details  1 GU:   Ureteral calculus - N20.1 Acute, Uncomplicated - Patient is status post left ureteral stent placement and will need to schedule surgery to remove stone. The risks and benefits of the left ureteroscopy with laser lithotripsy and stent exchange were discussed with the patient. This will be scheduled in the near future. She will continue her antibiotics until the 14 day course is finished. I will send her urine for culture today.  2 NON-GU:   Bacteremia - R78.81 Acute, Life Threatening - Patient is on directed antibiotic therapy for 14 days. I will recheck urine culture prior to next surgery. Leukocytosis resolved prior to discharge from hospital.    PLAN:           Orders Labs CULTURE, URINE

## 2019-10-07 ENCOUNTER — Encounter (HOSPITAL_BASED_OUTPATIENT_CLINIC_OR_DEPARTMENT_OTHER): Payer: Self-pay | Admitting: Urology

## 2019-10-07 ENCOUNTER — Encounter (HOSPITAL_BASED_OUTPATIENT_CLINIC_OR_DEPARTMENT_OTHER)
Admission: RE | Disposition: A | Discharge status: Home / Self Care | Payer: Self-pay | Source: Home / Self Care | Attending: Urology

## 2019-10-07 ENCOUNTER — Ambulatory Visit (HOSPITAL_BASED_OUTPATIENT_CLINIC_OR_DEPARTMENT_OTHER)
Admission: RE | Admit: 2019-10-07 | Discharge: 2019-10-07 | Disposition: A | Discharge status: Home / Self Care | Payer: Medicaid Other | Attending: Urology | Admitting: Urology

## 2019-10-07 DIAGNOSIS — F17200 Nicotine dependence, unspecified, uncomplicated: Secondary | ICD-10-CM | POA: Diagnosis not present

## 2019-10-07 DIAGNOSIS — N201 Calculus of ureter: Secondary | ICD-10-CM | POA: Diagnosis not present

## 2019-10-07 DIAGNOSIS — Z5309 Procedure and treatment not carried out because of other contraindication: Secondary | ICD-10-CM | POA: Diagnosis not present

## 2019-10-07 DIAGNOSIS — B9629 Other Escherichia coli [E. coli] as the cause of diseases classified elsewhere: Secondary | ICD-10-CM | POA: Diagnosis not present

## 2019-10-07 DIAGNOSIS — R7881 Bacteremia: Secondary | ICD-10-CM | POA: Insufficient documentation

## 2019-10-07 DIAGNOSIS — R350 Frequency of micturition: Secondary | ICD-10-CM | POA: Diagnosis present

## 2019-10-07 HISTORY — DX: Personal history of urinary calculi: Z87.442

## 2019-10-07 LAB — POCT PREGNANCY, URINE: Preg Test, Ur: NEGATIVE

## 2019-10-07 LAB — RAPID URINE DRUG SCREEN, HOSP PERFORMED
Amphetamines: POSITIVE — AB
Barbiturates: NOT DETECTED
Benzodiazepines: NOT DETECTED
Cocaine: NOT DETECTED
Opiates: NOT DETECTED
Tetrahydrocannabinol: POSITIVE — AB

## 2019-10-07 SURGERY — CYSTOURETEROSCOPY, WITH RETROGRADE PYELOGRAM AND STENT INSERTION
Anesthesia: General | Laterality: Left

## 2019-10-07 MED ORDER — IOHEXOL 300 MG/ML  SOLN
INTRAMUSCULAR | Status: DC | PRN
  Administered 2019-10-07: 50 mL

## 2019-10-07 MED ORDER — ARTIFICIAL TEARS OPHTHALMIC OINT
TOPICAL_OINTMENT | OPHTHALMIC | Status: AC
  Filled 2019-10-07: qty 3.5

## 2019-10-07 MED ORDER — KETOROLAC TROMETHAMINE 30 MG/ML IJ SOLN
INTRAMUSCULAR | Status: AC
  Filled 2019-10-07: qty 1

## 2019-10-07 MED ORDER — MIDAZOLAM HCL 2 MG/2ML IJ SOLN
INTRAMUSCULAR | Status: AC
  Filled 2019-10-07: qty 2

## 2019-10-07 MED ORDER — EPHEDRINE 5 MG/ML INJ
INTRAVENOUS | Status: AC
  Filled 2019-10-07: qty 10

## 2019-10-07 MED ORDER — CEFAZOLIN SODIUM-DEXTROSE 2-4 GM/100ML-% IV SOLN
INTRAVENOUS | Status: AC
  Filled 2019-10-07: qty 100

## 2019-10-07 MED ORDER — DEXAMETHASONE SODIUM PHOSPHATE 10 MG/ML IJ SOLN
INTRAMUSCULAR | Status: AC
  Filled 2019-10-07: qty 1

## 2019-10-07 MED ORDER — CEFAZOLIN SODIUM-DEXTROSE 2-4 GM/100ML-% IV SOLN
2.0000 g | INTRAVENOUS | Status: DC
  Filled 2019-10-07: qty 100

## 2019-10-07 MED ORDER — ONDANSETRON HCL 4 MG/2ML IJ SOLN
INTRAMUSCULAR | Status: AC
  Filled 2019-10-07: qty 2

## 2019-10-07 MED ORDER — FENTANYL CITRATE (PF) 100 MCG/2ML IJ SOLN
INTRAMUSCULAR | Status: AC
  Filled 2019-10-07: qty 2

## 2019-10-07 MED ORDER — SODIUM CHLORIDE 0.9 % IR SOLN
Status: DC | PRN
  Administered 2019-10-07: 3000 mL

## 2019-10-07 MED ORDER — PROPOFOL 10 MG/ML IV BOLUS
INTRAVENOUS | Status: AC
  Filled 2019-10-07: qty 40

## 2019-10-07 MED ORDER — LACTATED RINGERS IV SOLN
INTRAVENOUS | Status: DC
  Filled 2019-10-07: qty 1000

## 2019-10-07 MED ORDER — LIDOCAINE 2% (20 MG/ML) 5 ML SYRINGE
INTRAMUSCULAR | Status: AC
  Filled 2019-10-07: qty 5

## 2019-10-07 SURGICAL SUPPLY — 18 items
BAG DRAIN URO-CYSTO SKYTR STRL (DRAIN) IMPLANT
BASKET ZERO TIP NITINOL 2.4FR (BASKET) IMPLANT
CATH URET 5FR 28IN OPEN ENDED (CATHETERS) IMPLANT
CLOTH BEACON ORANGE TIMEOUT ST (SAFETY) IMPLANT
DRSG TEGADERM 4X4.75 (GAUZE/BANDAGES/DRESSINGS) IMPLANT
EXTRACTOR STONE 1.7FRX115CM (UROLOGICAL SUPPLIES) IMPLANT
FIBER LASER TRAC TIP (UROLOGICAL SUPPLIES) IMPLANT
GLOVE BIO SURGEON STRL SZ 6.5 (GLOVE) IMPLANT
GLOVE BIO SURGEONS STRL SZ 6.5 (GLOVE)
GOWN STRL REUS W/TWL LRG LVL3 (GOWN DISPOSABLE) IMPLANT
GUIDEWIRE STR DUAL SENSOR (WIRE) IMPLANT
IV NS IRRIG 3000ML ARTHROMATIC (IV SOLUTION) IMPLANT
KIT TURNOVER CYSTO (KITS) IMPLANT
MANIFOLD NEPTUNE II (INSTRUMENTS) IMPLANT
PACK CYSTO (CUSTOM PROCEDURE TRAY) IMPLANT
TUBE CONNECTING 12'X1/4 (SUCTIONS)
TUBE CONNECTING 12X1/4 (SUCTIONS) IMPLANT
TUBING UROLOGY SET (TUBING) IMPLANT

## 2019-10-29 ENCOUNTER — Telehealth: Payer: Medicaid Other | Admitting: Family

## 2019-10-29 DIAGNOSIS — R300 Dysuria: Secondary | ICD-10-CM

## 2019-10-29 NOTE — [place] Notes (Signed)
Based on what you shared with me, I feel your condition warrants further evaluation and I recommend that you be seen for a face to face office visit.  Given your symptoms you need to be seen today face-to-face to rule out a more serious infection. I also recommend calling your urologist office today and telling them of your symptoms.   NOTE: If you entered your credit card information for this eVisit, you will not be charged. You may see a "hold" on your card for the $35 but that hold will drop off and you will not have a charge processed.   If you are having a true medical emergency please call 911.      For an urgent face to face visit, [place] has five urgent care centers for your convenience:      NEW:  [facility] at [facility] Directions [phone] [address] [place], [place] [address] . 10 am - 6pm Monday - Friday    [facility]) Get Driving Directions [phone] [address] [place], [place] [address] . 10 am to 8 pm Monday-Friday . 12 pm to 8 pm [facility] Urgent Care at [facility] Get Driving Directions [phone] [address] [place], [place] [address] . 8 am to 8 pm Monday-Friday . 9 am to 6 pm Saturday . 11 am to 6 pm Sunday     [facility] Ur[facility] Care at [facility] Get Driving Directions  [phone] [address] [place], [place] [address] . 8 am to 8 pm Monday-Friday . 8 am to 4 pm [facility] Urgent Care at [facility] Directions [phone] [address] [place], [place] [address] . 12 pm to 6 pm Monday-Friday      Your e-visit answers were reviewed by a board certified advanced clinical practitioner to complete your personal care plan.  Thank you for using e-Visits.

## 2019-11-02 ENCOUNTER — Encounter (INDEPENDENT_AMBULATORY_CARE_PROVIDER_SITE_OTHER): Payer: Self-pay

## 2019-11-04 ENCOUNTER — Other Ambulatory Visit: Payer: Self-pay

## 2019-11-04 ENCOUNTER — Other Ambulatory Visit: Payer: Self-pay | Admitting: Urology

## 2019-11-04 ENCOUNTER — Encounter (HOSPITAL_BASED_OUTPATIENT_CLINIC_OR_DEPARTMENT_OTHER): Payer: Self-pay | Admitting: Urology

## 2019-11-04 NOTE — [place] Notes (Signed)
Spoke w/ via phone for pre-op interview---patient Lab needs dos----urine drug screen per Shanda Bumps zanetto pa, urine poct               Lab results------may use 09-20-2019 ekg for 11-11-2019 surgery per Shanda Bumps zanetto pa ( see 10-02-2019 progress note) COVID test ------11-07-2019 @245  pm Arrive at -------615 am 11-11-2019 NPO after ------midnight Medications to take morning of surgery -----albuterol inhaler pnr/bring inhaler, oxybutynin Diabetic medication -----n/a Patient Special Instructions -----none Pre-Op special Istructions -----none Patient verbalized understanding of instructions that were given at this phone interview. Patient denies shortness of breath, chest pain, fever, cough a this phone interview.

## 2019-11-07 ENCOUNTER — Other Ambulatory Visit (HOSPITAL_COMMUNITY): Payer: Medicaid Other

## 2019-11-08 ENCOUNTER — Inpatient Hospital Stay (HOSPITAL_COMMUNITY): Admission: RE | Admit: 2019-11-08 | Payer: Medicaid Other | Source: Ambulatory Visit

## 2019-11-10 ENCOUNTER — Other Ambulatory Visit (HOSPITAL_COMMUNITY): Admission: RE | Admit: 2019-11-10 | Payer: MEDICAID | Source: Ambulatory Visit
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
## 2019-11-11 ENCOUNTER — Encounter (HOSPITAL_BASED_OUTPATIENT_CLINIC_OR_DEPARTMENT_OTHER): Payer: Self-pay | Admitting: Urology

## 2019-11-11 ENCOUNTER — Other Ambulatory Visit: Payer: Self-pay

## 2019-11-11 ENCOUNTER — Ambulatory Visit (HOSPITAL_BASED_OUTPATIENT_CLINIC_OR_DEPARTMENT_OTHER)
Admission: RE | Admit: 2019-11-11 | Discharge: 2019-11-11 | Disposition: A | Discharge status: Home / Self Care | Payer: Medicaid Other | Attending: Urology | Admitting: Urology

## 2019-11-11 ENCOUNTER — Ambulatory Visit (HOSPITAL_BASED_OUTPATIENT_CLINIC_OR_DEPARTMENT_OTHER): Payer: Medicaid Other | Admitting: Anesthesiology

## 2019-11-11 ENCOUNTER — Encounter (HOSPITAL_BASED_OUTPATIENT_CLINIC_OR_DEPARTMENT_OTHER)
Admission: RE | Disposition: A | Discharge status: Home / Self Care | Payer: Self-pay | Source: Home / Self Care | Attending: Urology

## 2019-11-11 DIAGNOSIS — N201 Calculus of ureter: Secondary | ICD-10-CM | POA: Diagnosis not present

## 2019-11-11 DIAGNOSIS — J45909 Unspecified asthma, uncomplicated: Secondary | ICD-10-CM | POA: Diagnosis not present

## 2019-11-11 DIAGNOSIS — F329 Major depressive disorder, single episode, unspecified: Secondary | ICD-10-CM | POA: Diagnosis not present

## 2019-11-11 DIAGNOSIS — R7881 Bacteremia: Secondary | ICD-10-CM | POA: Diagnosis not present

## 2019-11-11 DIAGNOSIS — F17200 Nicotine dependence, unspecified, uncomplicated: Secondary | ICD-10-CM | POA: Diagnosis not present

## 2019-11-11 DIAGNOSIS — M797 Fibromyalgia: Secondary | ICD-10-CM | POA: Insufficient documentation

## 2019-11-11 DIAGNOSIS — F419 Anxiety disorder, unspecified: Secondary | ICD-10-CM | POA: Diagnosis not present

## 2019-11-11 DIAGNOSIS — K851 Biliary acute pancreatitis without necrosis or infection: Secondary | ICD-10-CM | POA: Diagnosis not present

## 2019-11-11 DIAGNOSIS — D72829 Elevated white blood cell count, unspecified: Secondary | ICD-10-CM | POA: Diagnosis not present

## 2019-11-11 DIAGNOSIS — Z20822 Contact with and (suspected) exposure to covid-19: Secondary | ICD-10-CM | POA: Insufficient documentation

## 2019-11-11 HISTORY — PX: CYSTOSCOPY/URETEROSCOPY/HOLMIUM LASER/STENT PLACEMENT: SHX6546

## 2019-11-11 LAB — RESPIRATORY PANEL BY RT PCR (FLU A&B, COVID)
Influenza A by PCR: NEGATIVE
Influenza B by PCR: NEGATIVE
SARS Coronavirus 2 by RT PCR: NEGATIVE

## 2019-11-11 LAB — POCT PREGNANCY, URINE: Preg Test, Ur: NEGATIVE

## 2019-11-11 LAB — RAPID URINE DRUG SCREEN, HOSP PERFORMED
Amphetamines: POSITIVE — AB
Barbiturates: NOT DETECTED
Benzodiazepines: POSITIVE — AB
Cocaine: NOT DETECTED
Opiates: NOT DETECTED
Tetrahydrocannabinol: POSITIVE — AB

## 2019-11-11 SURGERY — CYSTOSCOPY/URETEROSCOPY/HOLMIUM LASER/STENT PLACEMENT
Anesthesia: General | Site: Ureter | Laterality: Left

## 2019-11-11 MED ORDER — OXYCODONE HCL 5 MG/5ML PO SOLN: 5.0000 mg | Freq: Once | ORAL | Status: DC | PRN

## 2019-11-11 MED ORDER — ONDANSETRON HCL 4 MG/2ML IJ SOLN
INTRAMUSCULAR | Status: AC
  Filled 2019-11-11: qty 2

## 2019-11-11 MED ORDER — PROPOFOL 10 MG/ML IV BOLUS
INTRAVENOUS | Status: AC
  Filled 2019-11-11: qty 20

## 2019-11-11 MED ORDER — MIDAZOLAM HCL 2 MG/2ML IJ SOLN
INTRAMUSCULAR | Status: AC
  Filled 2019-11-11: qty 2

## 2019-11-11 MED ORDER — CIPROFLOXACIN IN D5W 400 MG/200ML IV SOLN
INTRAVENOUS | Status: AC
  Filled 2019-11-11: qty 200

## 2019-11-11 MED ORDER — DEXAMETHASONE SODIUM PHOSPHATE 10 MG/ML IJ SOLN
INTRAMUSCULAR | Status: DC | PRN
  Administered 2019-11-11: 10 mg via INTRAVENOUS

## 2019-11-11 MED ORDER — ACETAMINOPHEN 160 MG/5ML PO SOLN: 325.0000 mg | ORAL | Status: DC | PRN

## 2019-11-11 MED ORDER — OXYCODONE HCL 5 MG PO TABS: 5.0000 mg | ORAL_TABLET | Freq: Once | ORAL | Status: DC | PRN

## 2019-11-11 MED ORDER — PROPOFOL 10 MG/ML IV BOLUS
INTRAVENOUS | Status: DC | PRN
  Administered 2019-11-11: 100 mg via INTRAVENOUS

## 2019-11-11 MED ORDER — ONDANSETRON HCL 4 MG/2ML IJ SOLN: 4.0000 mg | Freq: Once | INTRAMUSCULAR | Status: DC | PRN

## 2019-11-11 MED ORDER — LIDOCAINE 2% (20 MG/ML) 5 ML SYRINGE
INTRAMUSCULAR | Status: AC
  Filled 2019-11-11: qty 5

## 2019-11-11 MED ORDER — TRAMADOL HCL 50 MG PO TABS: 50.0000 mg | ORAL_TABLET | Freq: Four times a day (QID) | ORAL | 0 refills | Status: AC | PRN

## 2019-11-11 MED ORDER — FENTANYL CITRATE (PF) 100 MCG/2ML IJ SOLN
INTRAMUSCULAR | Status: AC
  Filled 2019-11-11: qty 2

## 2019-11-11 MED ORDER — CIPROFLOXACIN IN D5W 400 MG/200ML IV SOLN
400.0000 mg | Freq: Two times a day (BID) | INTRAVENOUS | Status: DC
  Administered 2019-11-11: 12:00:00 400 mg via INTRAVENOUS

## 2019-11-11 MED ORDER — MIDAZOLAM HCL 5 MG/5ML IJ SOLN
INTRAMUSCULAR | Status: DC | PRN
  Administered 2019-11-11: 2 mg via INTRAVENOUS

## 2019-11-11 MED ORDER — FENTANYL CITRATE (PF) 100 MCG/2ML IJ SOLN
INTRAMUSCULAR | Status: DC | PRN
  Administered 2019-11-11: 50 ug via INTRAVENOUS

## 2019-11-11 MED ORDER — LIDOCAINE 2% (20 MG/ML) 5 ML SYRINGE
INTRAMUSCULAR | Status: DC | PRN
  Administered 2019-11-11: 60 mg via INTRAVENOUS

## 2019-11-11 MED ORDER — MEPERIDINE HCL 25 MG/ML IJ SOLN: 6.2500 mg | INTRAMUSCULAR | Status: DC | PRN

## 2019-11-11 MED ORDER — KETOROLAC TROMETHAMINE 30 MG/ML IJ SOLN
INTRAMUSCULAR | Status: DC | PRN
  Administered 2019-11-11: 30 mg via INTRAVENOUS

## 2019-11-11 MED ORDER — PHENYLEPHRINE 40 MCG/ML (10ML) SYRINGE FOR IV PUSH (FOR BLOOD PRESSURE SUPPORT)
PREFILLED_SYRINGE | INTRAVENOUS | Status: AC
  Filled 2019-11-11: qty 10

## 2019-11-11 MED ORDER — KETOROLAC TROMETHAMINE 30 MG/ML IJ SOLN
INTRAMUSCULAR | Status: AC
  Filled 2019-11-11: qty 1

## 2019-11-11 MED ORDER — FENTANYL CITRATE (PF) 100 MCG/2ML IJ SOLN: 25.0000 ug | INTRAMUSCULAR | Status: DC | PRN

## 2019-11-11 MED ORDER — ONDANSETRON HCL 4 MG/2ML IJ SOLN
INTRAMUSCULAR | Status: DC | PRN
  Administered 2019-11-11: 4 mg via INTRAVENOUS

## 2019-11-11 MED ORDER — DEXAMETHASONE SODIUM PHOSPHATE 10 MG/ML IJ SOLN
INTRAMUSCULAR | Status: AC
  Filled 2019-11-11: qty 1

## 2019-11-11 MED ORDER — PHENAZOPYRIDINE HCL 100 MG PO TABS
100.0000 mg | ORAL_TABLET | Freq: Three times a day (TID) | ORAL | 0 refills | Status: AC | PRN
Start: 2019-11-11 — End: 2020-11-10

## 2019-11-11 MED ORDER — LACTATED RINGERS IV SOLN: INTRAVENOUS | Status: DC

## 2019-11-11 MED ORDER — SODIUM CHLORIDE 0.9 % IR SOLN
Status: DC | PRN
  Administered 2019-11-11: 3000 mL

## 2019-11-11 MED ORDER — ACETAMINOPHEN 325 MG PO TABS: 325.0000 mg | ORAL_TABLET | ORAL | Status: DC | PRN

## 2019-11-11 SURGICAL SUPPLY — 22 items
BAG DRAIN URO-CYSTO SKYTR STRL (DRAIN) ×3 IMPLANT
BASKET ZERO TIP NITINOL 2.4FR (BASKET) ×3 IMPLANT
CATH URET 5FR 28IN OPEN ENDED (CATHETERS) ×3 IMPLANT
CLOTH BEACON ORANGE TIMEOUT ST (SAFETY) ×3 IMPLANT
DRSG TEGADERM 4X4.75 (GAUZE/BANDAGES/DRESSINGS) IMPLANT
EXTRACTOR STONE 1.7FRX115CM (UROLOGICAL SUPPLIES) IMPLANT
FIBER LASER TRAC TIP (UROLOGICAL SUPPLIES) ×3 IMPLANT
GLOVE BIO SURGEON STRL SZ 6.5 (GLOVE) ×4 IMPLANT
GLOVE BIO SURGEON STRL SZ7.5 (GLOVE) ×3 IMPLANT
GLOVE BIO SURGEONS STRL SZ 6.5 (GLOVE) ×2
GLOVE BIOGEL PI IND STRL 7.0 (GLOVE) ×1 IMPLANT
GLOVE BIOGEL PI INDICATOR 7.0 (GLOVE) ×2
GOWN STRL REUS W/TWL LRG LVL3 (GOWN DISPOSABLE) ×3 IMPLANT
GOWN STRL REUS W/TWL XL LVL3 (GOWN DISPOSABLE) ×3 IMPLANT
GUIDEWIRE STR DUAL SENSOR (WIRE) ×3 IMPLANT
IV NS IRRIG 3000ML ARTHROMATIC (IV SOLUTION) ×3 IMPLANT
KIT TURNOVER CYSTO (KITS) ×3 IMPLANT
MANIFOLD NEPTUNE II (INSTRUMENTS) ×3 IMPLANT
TRAY CYSTO PACK (CUSTOM PROCEDURE TRAY) ×3 IMPLANT
TUBE CONNECTING 12'X1/4 (SUCTIONS) ×1
TUBE CONNECTING 12X1/4 (SUCTIONS) ×2 IMPLANT
TUBING UROLOGY SET (TUBING) ×3 IMPLANT

## 2019-11-11 NOTE — Anesthesia Procedure Notes (Signed)
Procedure Name: LMA Insertion Date/Time: 11/11/2019 11:45 AM Performed by: Briant Sites, CRNA Pre-anesthesia Checklist: Patient identified, Emergency Drugs available, Suction available and Patient being monitored Patient Re-evaluated:Patient Re-evaluated prior to induction Oxygen Delivery Method: Circle system utilized Preoxygenation: Pre-oxygenation with 100% oxygen Induction Type: IV induction Ventilation: Mask ventilation without difficulty LMA: LMA inserted LMA Size: 4.0 Number of attempts: 1 Airway Equipment and Method: Bite block Placement Confirmation: positive ETCO2 Tube secured with: Tape Dental Injury: Teeth and Oropharynx as per pre-operative assessment

## 2019-11-11 NOTE — Transfer of Care (Signed)
Immediate Anesthesia Transfer of Care Note  Patient: Kelli Washington  Procedure(s) Performed: CYSTOSCOPY/URETEROSCOPY/HOLMIUM LASER/STENT PLACEMENT (Left )  Patient Location: PACU  Anesthesia Type:General  Level of Consciousness: drowsy  Airway & Oxygen Therapy: Patient Spontanous Breathing and Patient connected to nasal cannula oxygen  Post-op Assessment: Report given to RN  Post vital signs: Reviewed and stable  Last Vitals:  Vitals Value Taken Time  BP 109/78 11/11/19 1225  Temp    Pulse 70 11/11/19 1227  Resp 10 11/11/19 1227  SpO2 100 % 11/11/19 1227  Vitals shown include unvalidated device data.  Last Pain:  Vitals:   11/11/19 1109  TempSrc: Oral         Complications: No apparent anesthesia complications

## 2019-11-11 NOTE — Op Note (Signed)
Preoperative diagnosis: left ureteral calculus  Postoperative diagnosis: left ureteral calculus  Procedure:  1. Cystoscopy 2. left ureteroscopy and stone removal 3. Ureteroscopic laser lithotripsy 4. left 41F x 24 ureteral stent placement with string  Surgeon: Kasandra Knudsen, MD  Anesthesia: General  Complications: None  Intraoperative findings:  1. Normal urethra 2. Cystoscopy revealed normal bladder mucose 3. 41Fr x 24 cm JJ ureteral stent with string   EBL: Minimal  Specimens: 1. left ureteral calculus  Disposition of specimens: [facility] for stone analysis  Indication: Kelli Washington is a 39 y.o.   patient with a 5 mm left ureteral stone and associated pain and infection underwent urgent stent placement.  She was treated for bacteremia while in hospital and now returns for definitive stone management.  After reviewing the management options for treatment, the patient elected to proceed with the above surgical procedure(s). We have discussed the potential benefits and risks of the procedure, side effects of the proposed treatment, the likelihood of the patient achieving the goals of the procedure, and any potential problems that might occur during the procedure or recuperation. Informed consent has been obtained.   Description of procedure:  The patient was taken to the operating room and general anesthesia was induced.  The patient was placed in the dorsal lithotomy position, prepped and draped in the usual sterile fashion, and preoperative antibiotics were administered. A preoperative time-out was performed.   Cystourethroscopy was performed.  The patient's urethra was examined and was normal. The bladder was then systematically examined in its entirety. There was no evidence for any bladder tumors, stones, or other mucosal pathology.    Attention then turned to the left ureteral orifice and graspers were used to grab the stent and bring it to the  urethral meatus.  A sensor wire was then advanced through the ureteral stent and up to the kidney which was confirmed with fluoroscopy.  The cystoscope was removed.  Next semirigid ureteroscopy took place until the stone was encountered in the mid left ureter.   The stone was then fragmented with the 200 micron holmium laser fiber. All visible stone fragments were then removed from the ureter with a 0 tip basket.  Reinspection of the ureter revealed no remaining visible stones or fragments > 2 mm.   The wire was then backloaded through the cystoscope and a ureteral stent was advance over the wire using Seldinger technique.  The stent was positioned appropriately under fluoroscopic and cystoscopic guidance.  The wire was then removed with an adequate stent curl noted in the renal pelvis as well as in the bladder.  The bladder was then emptied and the procedure ended.  The patient appeared to tolerate the procedure well and without complications.  The patient was able to be awakened and transferred to the recovery unit in satisfactory condition.   Disposition: The tether of the stent was left on and tucked inside the patient's vagina.  Instructions for removing the stent have been provided to the patient.

## 2019-11-11 NOTE — Anesthesia Postprocedure Evaluation (Signed)
Anesthesia Post Note  Patient: Kelli Washington  Procedure(s) Performed: CYSTOSCOPY/URETEROSCOPY/HOLMIUM LASER/STENT PLACEMENT (Left Ureter)     Patient location during evaluation: PACU Anesthesia Type: General Level of consciousness: awake and alert Pain management: pain level controlled Vital Signs Assessment: post-procedure vital signs reviewed and stable Respiratory status: spontaneous breathing, nonlabored ventilation, respiratory function stable and patient connected to nasal cannula oxygen Cardiovascular status: blood pressure returned to baseline and stable Postop Assessment: no apparent nausea or vomiting Anesthetic complications: no    Last Vitals:  Vitals:   11/11/19 1245 11/11/19 1300  BP: 108/75 116/73  Pulse: 72 70  Resp: 11 11  Temp:    SpO2: 95% 94%    Last Pain:  Vitals:   11/11/19 1345  TempSrc:   PainSc: 0-No pain                 Nekeya Briski

## 2019-11-11 NOTE — Anesthesia Preprocedure Evaluation (Addendum)
Anesthesia Evaluation  Patient identified by MRN, date of birth, ID band Patient awake    Reviewed: Allergy & Precautions, NPO status , Patient's Chart, lab work & pertinent test results  History of Anesthesia Complications Negative for: history of anesthetic complications  Airway Mallampati: I  TM Distance: >3 FB Neck ROM: Full    Dental no notable dental hx. (+) Teeth Intact, Dental Advisory Given   Pulmonary neg pulmonary ROS, asthma , Current Smoker and Patient abstained from smoking.,    Pulmonary exam normal breath sounds clear to auscultation       Cardiovascular negative cardio ROS Normal cardiovascular exam Rhythm:Regular Rate:Normal     Neuro/Psych PSYCHIATRIC DISORDERS Anxiety Depression negative neurological ROS  negative psych ROS   GI/Hepatic negative GI ROS, Neg liver ROS,   Endo/Other  negative endocrine ROS  Renal/GU Renal disease (kidney stones, left ureteral stone)negative Renal ROS  negative genitourinary   Musculoskeletal negative musculoskeletal ROS (+) Fibromyalgia -  Abdominal   Peds negative pediatric ROS (+)  Hematology negative hematology ROS (+) Blood dyscrasia, anemia ,   Anesthesia Other Findings   Reproductive/Obstetrics negative OB ROS                            Anesthesia Physical  Anesthesia Plan  ASA: II  Anesthesia Plan: General   Post-op Pain Management:    Induction: Intravenous  PONV Risk Score and Plan: 2 and Ondansetron, Dexamethasone, Midazolam and Treatment may vary due to age or medical condition  Airway Management Planned: LMA  Additional Equipment: None  Intra-op Plan:   Post-operative Plan: Extubation in OR  Informed Consent:   Plan Discussed with: Anesthesiologist, CRNA and Surgeon  Anesthesia Plan Comments: ( CASE CANCELLED 10/07/19 FOR POSITIVE UDS)       Anesthesia Quick Evaluation

## 2019-11-11 NOTE — H&P (Signed)
CC/HPI: cc: Hospital follow up   10/02/19: 39 year old woman presents the ER with left flank pain found to have a 5 mm ureteral calculus associated with significant leukocytosis and pyelonephritis of the kidney. She subsequently found to be bacteremic with pansensitive E coli. She returns today for follow-up after undergoing a stent placement and IV antibiotics the hospital. She is feeling well and without complaints today. She denies fever, chills, nausea or vomiting. She has no pain with a stent.    11/03/2019: She returns today for f/u, still with a ureteral stent on the left placed in the setting of bacteremia and ureteral obstruction. Pt was tolerating her stent well with prn use of oxybutynin until that prescription ran out. Now presents with increased urinary urgency, gross hematuria with mild burning urination. Primarily having lower quadrant abdominal and suprapubic pain but no acute exacerbations of left-sided renal colic. Denies interval fevers or chills, nausea/vomiting.     ALLERGIES: Egg    MEDICATIONS: Oxybutynin Chloride     GU PSH: Cystoscopy Insert Stent, Left - 09/20/2019     NON-GU PSH: None   GU PMH: Ureteral calculus, Patient is status post left ureteral stent placement and will need to schedule surgery to remove stone. The risks and benefits of the left ureteroscopy with laser lithotripsy and stent exchange were discussed with the patient. This will be scheduled in the near future. She will continue her antibiotics until the 14 day course is finished. I will send her urine for culture today. - 10/02/2019    NON-GU PMH: Bacteremia, Patient is on directed antibiotic therapy for 14 days. I will recheck urine culture prior to next surgery. Leukocytosis resolved prior to discharge from hospital. - 10/02/2019    FAMILY HISTORY: None   SOCIAL HISTORY: Marital Status: Unknown Preferred Language: English; Ethnicity: Not Hispanic Or Latino; Race: White Current Smoking Status:  Patient smokes.   Tobacco Use Assessment Completed: Used Tobacco in last 30 days? Social Drinker.  Drinks 2 caffeinated drinks per day.    REVIEW OF SYSTEMS:    GU Review Female:   Patient reports burning /pain with urination. Patient denies frequent urination, hard to postpone urination, get up at night to urinate, leakage of urine, stream starts and stops, trouble starting your stream, have to strain to urinate, and being pregnant.  Gastrointestinal (Upper):   Patient denies nausea, vomiting, and indigestion/ heartburn.  Gastrointestinal (Lower):   Patient denies constipation and diarrhea.  Constitutional:   Patient denies fever, night sweats, weight loss, and fatigue.  Skin:   Patient denies skin rash/ lesion and itching.  Eyes:   Patient denies blurred vision and double vision.  Ears/ Nose/ Throat:   Patient denies sore throat and sinus problems.  Hematologic/Lymphatic:   Patient denies swollen glands and easy bruising.  Cardiovascular:   Patient denies leg swelling and chest pains.  Respiratory:   Patient denies cough and shortness of breath.  Endocrine:   Patient denies excessive thirst.  Musculoskeletal:   Patient denies back pain and joint pain.  Neurological:   Patient denies headaches and dizziness.  Psychologic:   Patient denies depression and anxiety.   Notes: blood in urine    VITAL SIGNS:      11/03/2019 02:46 PM  BP 109/75 mmHg  Pulse 89 /min  Temperature 97.7 F / 36.5 C   MULTI-SYSTEM PHYSICAL EXAMINATION:    Constitutional: Well-nourished. No physical deformities. Normally developed. Good grooming.  Neck: Neck symmetrical, not swollen. Normal tracheal position.  Respiratory: No labored  breathing, no use of accessory muscles.   Cardiovascular: Normal temperature, normal extremity pulses, no swelling, no varicosities.  Skin: No paleness, no jaundice, no cyanosis. No lesion, no ulcer, no rash.  Neurologic / Psychiatric: Oriented to time, oriented to place, oriented  to person. No depression, no anxiety, no agitation.  Gastrointestinal: No mass, no tenderness, no rigidity, non obese abdomen. No CVA tenderness.  Musculoskeletal: Normal gait and station of head and neck.     Complexity of Data:  Source Of History:  Patient, Medical Record Summary  Records Review:   Previous Hospital Records, Previous Patient Records  Urine Test Review:   Urinalysis, Urine Culture  X-Ray Review: C.T. Abdomen/Pelvis: Reviewed Films. Reviewed Report.     11/03/19  Urinalysis  Urine Appearance Cloudy   Urine Color Brown   Urine Glucose Neg mg/dL  Urine Bilirubin Neg mg/dL  Urine Ketones Trace mg/dL  Urine Specific Gravity 1.025   Urine Blood 3+ ery/uL  Urine pH 6.0   Urine Protein 3+ mg/dL  Urine Urobilinogen 1.0 mg/dL  Urine Nitrites Neg   Urine Leukocyte Esterase 1+ leu/uL  Urine WBC/hpf 10 - 20/hpf   Urine RBC/hpf Packed/hpf   Urine Epithelial Cells 10 - 20/hpf   Urine Bacteria Mod (26-50/hpf)   Urine Mucous Present   Urine Yeast NS (Not Seen)   Urine Trichomonas Not Present   Urine Cystals NS (Not Seen)   Urine Casts NS (Not Seen)   Urine Sperm Not Present    PROCEDURES:          Urinalysis w/Scope Dipstick Dipstick Cont'd Micro  Color: Brown Bilirubin: Neg mg/dL WBC/hpf: 10 - 20/hpf  Appearance: Cloudy Ketones: Trace mg/dL RBC/hpf: Packed/hpf  Specific Gravity: 1.025 Blood: 3+ ery/uL Bacteria: Mod (26-50/hpf)  pH: 6.0 Protein: 3+ mg/dL Cystals: NS (Not Seen)  Glucose: Neg mg/dL Urobilinogen: 1.0 mg/dL Casts: NS (Not Seen)    Nitrites: Neg Trichomonas: Not Present    Leukocyte Esterase: 1+ leu/uL Mucous: Present      Epithelial Cells: 10 - 20/hpf      Yeast: NS (Not Seen)      Sperm: Not Present    ASSESSMENT:      ICD-10 Details  1 GU:   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

## 2019-11-11 NOTE — Interval H&P Note (Signed)
History and Physical Interval Note:  11/11/2019 11:09 AM  Kelli Washington  has presented today for surgery, with the diagnosis of LEFT URETERAL STONE.  The various methods of treatment have been discussed with the patient and family. After consideration of risks, benefits and other options for treatment, the patient has consented to  Procedure(s): CYSTOSCOPY/URETEROSCOPY/HOLMIUM LASER/STENT PLACEMENT (Left) as a surgical intervention.  The patient's history has been reviewed, patient examined, no change in status, stable for surgery.  I have reviewed the patient's chart and labs.  Questions were answered to the patient's satisfaction.     Jan Olano D Georgianna Band

## 2019-11-11 NOTE — Discharge Instructions (Signed)
DISCHARGE INSTRUCTIONS FOR KIDNEY STONE/URETERAL STENT   MEDICATIONS:  1.  Resume all your other meds from home - except do not take any extra narcotic pain meds that you may have at home.  2. Pyridium is to help with the burning/stinging when you urinate. 3. Tramadol is for moderate/severe pain, otherwise taking upto 1000 mg every 6 hours of plainTylenol will help treat your pain.   4. Take Bactrim one hour prior to removal of your stent.    ACTIVITY:  1. No strenuous activity x 1week  2. No driving while on narcotic pain medications  3. Drink plenty of water  4. Continue to walk at home - you can still get blood clots when you are at home, so keep active, but don't over do it.  5. May return to work/school tomorrow or when you feel ready   BATHING:  1. You can shower and we recommend daily showers  2. You have a string coming from your urethra: The stent string is attached to your ureteral stent. Do not pull on this.   SIGNS/SYMPTOMS TO CALL:  Please call us if you have a fever greater than 101.5, uncontrolled nausea/vomiting, uncontrolled pain, dizziness, unable to urinate, bloody urine, chest pain, shortness of breath, leg swelling, leg pain, redness around wound, drainage from wound, or any other concerns or questions.   You can reach [place] at [phone].   FOLLOW-UP:  1. You have a string attached to your stent, you may remove it on Monday, May 10. To do this, pull the strings until the stents are completely removed. You may feel an odd sensation in your back. Please take 1 tab PO Bactrim prior to stent removal.   CYSTOSCOPY HOME CARE INSTRUCTIONS  Activity: Rest for the remainder of the day.  Do not drive or operate equipment today.  You may resume normal activities in one to two days as instructed by your physician.   Meals: Drink plenty of liquids and eat light foods such as gelatin or soup this evening.  You may return to a normal meal plan tomorrow.  Return to Work: You  may return to work in one to two days or as instructed by your physician.  Special Instructions / Symptoms: Call your physician if any of these symptoms occur:   -persistent or heavy bleeding  -bleeding which continues after first few urination  -large blood clots that are difficult to pass  -urine stream diminishes or stops completely  -fever equal to or higher than 101 degrees Farenheit.  -cloudy urine with a strong, foul odor  -severe pain  Females should always wipe from front to back after elimination.  You may feel some burning pain when you urinate.  This should disappear with time.  Applying moist heat to the lower abdomen or a hot tub bath may help relieve the pain. \  Follow-Up / Date of Return Visit to Your Physician: as instructed   Post Anesthesia Home Care Instructions  Activity: Get plenty of rest for the remainder of the day. A responsible individual must stay with you for 24 hours following the procedure.  For the next 24 hours, DO NOT: -Drive a car -Paediatric nurse -Drink alcoholic beverages -Take any medication unless instructed by your physician -Make any legal decisions or sign important papers.  Meals: Start with liquid foods such as gelatin or soup. Progress to regular foods as tolerated. Avoid greasy, spicy, heavy foods. If nausea and/or vomiting occur, drink only clear liquids until the nausea  and/or vomiting subsides. Call your physician if vomiting continues.  Special Instructions/Symptoms: Your throat may feel dry or sore from the anesthesia or the breathing tube placed in your throat during surgery. If this causes discomfort, gargle with warm salt water. The discomfort should disappear within 24 hours.  If you had a scopolamine patch placed behind your ear for the management of post- operative nausea and/or vomiting:  1. The medication in the patch is effective for 72 hours, after which it should be removed.  Wrap patch in a tissue and discard in the  trash. Wash hands thoroughly with soap and water. 2. You may remove the patch earlier than 72 hours if you experience unpleasant side effects which may include dry mouth, dizziness or visual disturbances. 3. Avoid touching the patch. Wash your hands with soap and water after contact with the patch.    Call for an appointment to arrange follow-up.  Patient Signature:  ________________________________________________________  Nurse's Signature:  ________________________________________________________

## 2024-01-25 ENCOUNTER — Other Ambulatory Visit: Payer: Self-pay | Admitting: Medical Genetics

## 2024-04-18 ENCOUNTER — Other Ambulatory Visit: Payer: Self-pay | Admitting: *Deleted

## 2024-04-18 DIAGNOSIS — Z006 Encounter for examination for normal comparison and control in clinical research program: Secondary | ICD-10-CM
